# Patient Record
Sex: Male | Born: 1955 | Race: White | Hispanic: No | Marital: Married | Smoking: Never smoker
Health system: Southern US, Community
[De-identification: ages and names within clinical notes are randomized; demographics above are authoritative.]

## PROBLEM LIST (undated history)

## (undated) DIAGNOSIS — E785 Hyperlipidemia, unspecified: Secondary | ICD-10-CM

## (undated) DIAGNOSIS — Z87442 Personal history of urinary calculi: Secondary | ICD-10-CM

## (undated) DIAGNOSIS — I10 Essential (primary) hypertension: Secondary | ICD-10-CM

## (undated) DIAGNOSIS — N200 Calculus of kidney: Secondary | ICD-10-CM

## (undated) HISTORY — PX: NECK SURGERY: SHX720

## (undated) HISTORY — PX: COLONOSCOPY: SHX174

## (undated) HISTORY — PX: LITHOTRIPSY: SUR834

## (undated) HISTORY — PX: BACK SURGERY: SHX140

---

## 2003-11-05 ENCOUNTER — Ambulatory Visit (HOSPITAL_COMMUNITY): Admission: RE | Admit: 2003-11-05 | Discharge: 2003-11-06 | Payer: Self-pay | Admitting: Neurosurgery

## 2003-11-30 ENCOUNTER — Encounter: Admission: RE | Admit: 2003-11-30 | Discharge: 2003-11-30 | Payer: Self-pay | Admitting: Neurosurgery

## 2008-05-07 ENCOUNTER — Ambulatory Visit: Payer: Self-pay | Admitting: Gastroenterology

## 2009-12-01 ENCOUNTER — Observation Stay: Payer: Self-pay | Admitting: Internal Medicine

## 2013-04-28 ENCOUNTER — Ambulatory Visit: Payer: Self-pay | Admitting: Medical

## 2014-08-11 ENCOUNTER — Ambulatory Visit: Payer: Self-pay | Admitting: Internal Medicine

## 2017-01-15 ENCOUNTER — Encounter: Payer: Self-pay | Admitting: Emergency Medicine

## 2017-01-15 ENCOUNTER — Emergency Department
Admission: EM | Admit: 2017-01-15 | Discharge: 2017-01-16 | Disposition: A | Discharge status: Home / Self Care | Payer: BLUE CROSS/BLUE SHIELD | Attending: Emergency Medicine | Admitting: Emergency Medicine

## 2017-01-15 DIAGNOSIS — Z79899 Other long term (current) drug therapy: Secondary | ICD-10-CM | POA: Diagnosis not present

## 2017-01-15 DIAGNOSIS — R109 Unspecified abdominal pain: Secondary | ICD-10-CM | POA: Diagnosis present

## 2017-01-15 DIAGNOSIS — N200 Calculus of kidney: Secondary | ICD-10-CM | POA: Diagnosis not present

## 2017-01-15 DIAGNOSIS — I10 Essential (primary) hypertension: Secondary | ICD-10-CM | POA: Diagnosis not present

## 2017-01-15 HISTORY — DX: Hyperlipidemia, unspecified: E78.5

## 2017-01-15 HISTORY — DX: Essential (primary) hypertension: I10

## 2017-01-15 HISTORY — DX: Calculus of kidney: N20.0

## 2017-01-15 LAB — BASIC METABOLIC PANEL
ANION GAP: 9 (ref 5–15)
BUN: 24 mg/dL — AB (ref 6–20)
CALCIUM: 9.8 mg/dL (ref 8.9–10.3)
CO2: 26 mmol/L (ref 22–32)
CREATININE: 1.49 mg/dL — AB (ref 0.61–1.24)
Chloride: 104 mmol/L (ref 101–111)
GFR calc Af Amer: 58 mL/min — ABNORMAL LOW (ref >60)
GFR, EST NON AFRICAN AMERICAN: 50 mL/min — AB (ref >60)
GLUCOSE: 154 mg/dL — AB (ref 65–99)
Potassium: 3.4 mmol/L — ABNORMAL LOW (ref 3.5–5.1)
Sodium: 139 mmol/L (ref 135–145)

## 2017-01-15 LAB — CBC
HEMATOCRIT: 39.8 % — AB (ref 40.0–52.0)
Hemoglobin: 14.3 g/dL (ref 13.0–18.0)
MCH: 30.9 pg (ref 26.0–34.0)
MCHC: 35.8 g/dL (ref 32.0–36.0)
MCV: 86.3 fL (ref 80.0–100.0)
PLATELETS: 161 10*3/uL (ref 150–440)
RBC: 4.62 MIL/uL (ref 4.40–5.90)
RDW: 13.5 % (ref 11.5–14.5)
WBC: 10.6 10*3/uL (ref 3.8–10.6)

## 2017-01-15 NOTE — ED Triage Notes (Signed)
Pt to traige via w/c with no distress noted; pt reports x1-2hrs having right flank pain, nonradiating with no accomp symptoms; st hx of same with kidney stone

## 2017-01-16 ENCOUNTER — Encounter: Payer: Self-pay | Admitting: Emergency Medicine

## 2017-01-16 ENCOUNTER — Emergency Department: Payer: BLUE CROSS/BLUE SHIELD

## 2017-01-16 LAB — URINALYSIS, COMPLETE (UACMP) WITH MICROSCOPIC
Bacteria, UA: NONE SEEN
Bilirubin Urine: NEGATIVE
GLUCOSE, UA: NEGATIVE mg/dL
KETONES UR: NEGATIVE mg/dL
Leukocytes, UA: NEGATIVE
NITRITE: NEGATIVE
PH: 6 (ref 5.0–8.0)
Protein, ur: 30 mg/dL — AB
Specific Gravity, Urine: 1.019 (ref 1.005–1.030)

## 2017-01-16 MED ORDER — KETOROLAC TROMETHAMINE 30 MG/ML IJ SOLN
30.0000 mg | Freq: Once | INTRAMUSCULAR | Status: AC
  Administered 2017-01-16: 30 mg via INTRAVENOUS
  Filled 2017-01-16: qty 1

## 2017-01-16 MED ORDER — SODIUM CHLORIDE 0.9 % IV BOLUS (SEPSIS)
1000.0000 mL | Freq: Once | INTRAVENOUS | Status: AC
  Administered 2017-01-16: 1000 mL via INTRAVENOUS

## 2017-01-16 MED ORDER — ONDANSETRON 4 MG PO TBDP: 4.0000 mg | ORAL_TABLET | Freq: Three times a day (TID) | ORAL | 0 refills | Status: DC | PRN

## 2017-01-16 MED ORDER — OXYCODONE-ACETAMINOPHEN 5-325 MG PO TABS: 1.0000 | ORAL_TABLET | Freq: Four times a day (QID) | ORAL | 0 refills | Status: DC | PRN

## 2017-01-16 MED ORDER — HYDROMORPHONE HCL 1 MG/ML IJ SOLN
1.0000 mg | Freq: Once | INTRAMUSCULAR | Status: AC
  Administered 2017-01-16: 1 mg via INTRAVENOUS
  Filled 2017-01-16: qty 1

## 2017-01-16 MED ORDER — MORPHINE SULFATE (PF) 4 MG/ML IV SOLN
4.0000 mg | Freq: Once | INTRAVENOUS | Status: AC
  Administered 2017-01-16: 4 mg via INTRAVENOUS
  Filled 2017-01-16: qty 1

## 2017-01-16 MED ORDER — ONDANSETRON HCL 4 MG/2ML IJ SOLN
4.0000 mg | Freq: Once | INTRAMUSCULAR | Status: AC
  Administered 2017-01-16: 4 mg via INTRAVENOUS
  Filled 2017-01-16: qty 2

## 2017-01-16 NOTE — Discharge Instructions (Signed)
Please follow up with Dr Mena GoesEskridge to have your stone removed. It will not pass otherwise. Please return with any fever, vomiting and worsened pain

## 2017-01-16 NOTE — ED Provider Notes (Signed)
[facility] Emergency Department Provider Note   ____________________________________________   First MD Initiated Contact with Patient 01/15/17 2348     (approximate)  I have reviewed the triage vital signs and the nursing notes.   HISTORY  Chief Complaint Flank Pain    HPI Cameron Randall is a 61 y.o. male who comes into the hospital today thinking that he has a kidney stone. The patient reports that he has some right flank pain that started around 8 or 9 PM. The patient took some Tylenol around 738 but he states that it did not help. The patient also reports that he hasn't urinated as well so he is unsure if there is some blood in his urine. The patient has had some nausea but denies any vomiting. He is also not had any fevers. The patient rates his pain a 4 out of 10 in intensity and states it is constant and a little bit better than before. The patient does have a history of kidney stones and has had lithotripsy in the past but it was about 15-20 years ago. The patient is here today for evaluation of this right flank pain.   Past Medical History:  Diagnosis Date  . Hyperlipidemia   . Hypertension   . Kidney stones     There are no active problems to display for this patient.   Past Surgical History:  Procedure Laterality Date  . BACK SURGERY    . LITHOTRIPSY    . NECK SURGERY      Prior to Admission medications   Medication Sig Start Date End Date Taking? Authorizing Provider  carvedilol (COREG) 25 MG tablet Take 50 mg by mouth 2 (two) times daily.   Yes [provider]  lisinopril-hydrochlorothiazide (PRINZIDE,ZESTORETIC) 20-25 MG tablet Take 1 tablet by mouth daily.   Yes [provider]  pravastatin (PRAVACHOL) 80 MG tablet Take 80 mg by mouth every evening.   Yes [provider]  ondansetron (ZOFRAN ODT) 4 MG disintegrating tablet Take 1 tablet (4 mg total) by mouth every 8 (eight) hours as needed for nausea or  vomiting. 01/16/17   Rebecka Apley, MD  oxyCODONE-acetaminophen (ROXICET) 5-325 MG tablet Take 1 tablet by mouth every 6 (six) hours as needed. 01/16/17   Rebecka Apley, MD    Allergies Patient has no known allergies.  No family history on file.  Social History Social History  Substance Use Topics  . Smoking status: Never Smoker  . Smokeless tobacco: Current User  . Alcohol use No    Review of Systems  Constitutional: No fever/chills Eyes: No visual changes. ENT: No sore throat. Cardiovascular: Denies chest pain. Respiratory: Denies shortness of breath. Gastrointestinal: No abdominal pain.  No nausea, no vomiting.  No diarrhea.  No constipation. Genitourinary: Negative for dysuria. Musculoskeletal:  back pain. Skin: Negative for rash. Neurological: Negative for headaches, focal weakness or numbness.   ____________________________________________   PHYSICAL EXAM:  VITAL SIGNS: ED Triage Vitals  Enc Vitals Group     BP 01/15/17 2329 (!) 148/86     Pulse Rate 01/15/17 2329 66     Resp 01/15/17 2329 20     Temp 01/15/17 2329 97.9 F (36.6 C)     Temp Source 01/15/17 2329 Oral     SpO2 01/15/17 2329 97 %     Weight 01/15/17 2328 265 lb (120.2 kg)     Height 01/15/17 2328 5\' 6"  (1.676 m)     Head Circumference --  Peak Flow --      Pain Score 01/15/17 2328 8     Pain Loc --      Pain Edu? --      Excl. in GC? --     Constitutional: Alert and oriented. Well appearing and in moderate distress. Eyes: Conjunctivae are normal. PERRL. EOMI. Head: Atraumatic. Nose: No congestion/rhinnorhea. Mouth/Throat: Mucous membranes are moist.  Oropharynx non-erythematous. Cardiovascular: Normal rate, regular rhythm. Grossly normal heart sounds.  Good peripheral circulation. Respiratory: Normal respiratory effort.  No retractions. Lungs CTAB. Gastrointestinal: Soft and nontender. No distention. Positive bowel sounds, Right CVA tenderness Musculoskeletal: No lower  extremity tenderness nor edema.   Neurologic:  Normal speech and language.  Skin:  Skin is warm, dry and intact.  Psychiatric: Mood and affect are normal.   ____________________________________________   LABS (all labs ordered are listed, but only abnormal results are displayed)  Labs Reviewed  URINALYSIS, COMPLETE (UACMP) WITH MICROSCOPIC - Abnormal; Notable for the following:       Result Value   Color, Urine YELLOW (*)    APPearance CLEAR (*)    Hgb urine dipstick LARGE (*)    Protein, ur 30 (*)    Squamous Epithelial / LPF 0-5 (*)    All other components within normal limits  CBC - Abnormal; Notable for the following:    HCT 39.8 (*)    All other components within normal limits  BASIC METABOLIC PANEL - Abnormal; Notable for the following:    Potassium 3.4 (*)    Glucose, Bld 154 (*)    BUN 24 (*)    Creatinine, Ser 1.49 (*)    GFR calc non Af Amer 50 (*)    GFR calc Af Amer 58 (*)    All other components within normal limits   ____________________________________________  EKG  none ____________________________________________  RADIOLOGY  CT renal stone study ____________________________________________   PROCEDURES  Procedure(s) performed: None  Procedures  Critical Care performed: No  ____________________________________________   INITIAL IMPRESSION / ASSESSMENT AND PLAN / ED COURSE  Pertinent labs & imaging results that were available during my care of the patient were reviewed by me and considered in my medical decision making (see chart for details).  This is a 61 year old male who comes into the hospital today with some right flank pain. The patient did have a CT scan appears that he has a very large kidney stone. The patient has some elevation of his creatinine about 1.49 but he has no elevation of his white blood cell count notice he have any urinary tract infection on the urinalysis. I did give the patient a dose of morphine as well as a liter  of normal saline. He was still hurting so I gave him some Dilaudid and Toradol. I contacted the urologist on call Dr. Mena Goes and he did say it was okay to give the patient some Toradol. The patient's stone is too large for lithotripsy but he does need to follow-up so he can have it removed. I discussed this with the patient and he reports that he will follow-up. The patient be discharged to follow-up for his kidney stone.  Clinical Course as of Jan 17 423  Tue Jan 16, 2017  0101 9 x 7 x 22 mm RIGHT mid ureter calculus resulting and severe obstructive uropathy.  Normal appendix.   CT Renal Soundra Pilon [AW]    Clinical Course User Index [AW] Rebecka Apley, MD     ____________________________________________   FINAL CLINICAL  IMPRESSION(S) / ED DIAGNOSES  Final diagnoses:  Kidney stone      NEW MEDICATIONS STARTED DURING THIS VISIT:  Discharge Medication List as of 01/16/2017  3:24 AM    START taking these medications   Details  ondansetron (ZOFRAN ODT) 4 MG disintegrating tablet Take 1 tablet (4 mg total) by mouth every 8 (eight) hours as needed for nausea or vomiting., Starting Tue 01/16/2017, Print    oxyCODONE-acetaminophen (ROXICET) 5-325 MG tablet Take 1 tablet by mouth every 6 (six) hours as needed., Starting Tue 01/16/2017, Print         Note:  This document was prepared using Dragon voice recognition software and may include unintentional dictation errors.    Rebecka ApleyWebster, Syvilla Martin P, MD 01/16/17 [phone]

## 2017-01-17 ENCOUNTER — Other Ambulatory Visit: Payer: Self-pay | Admitting: Radiology

## 2017-01-17 ENCOUNTER — Telehealth: Payer: Self-pay | Admitting: Radiology

## 2017-01-17 ENCOUNTER — Ambulatory Visit (INDEPENDENT_AMBULATORY_CARE_PROVIDER_SITE_OTHER): Payer: BLUE CROSS/BLUE SHIELD | Admitting: Urology

## 2017-01-17 DIAGNOSIS — N200 Calculus of kidney: Secondary | ICD-10-CM | POA: Diagnosis not present

## 2017-01-17 DIAGNOSIS — N201 Calculus of ureter: Secondary | ICD-10-CM

## 2017-01-17 MED ORDER — KETOROLAC TROMETHAMINE 10 MG PO TABS: 10.0000 mg | ORAL_TABLET | Freq: Four times a day (QID) | ORAL | 0 refills | Status: DC | PRN

## 2017-01-17 NOTE — Progress Notes (Signed)
 01/17/2017 8:41 AM   Cameron Randall 12/15/1955 [id number]  Referring provider: Anderson, Marshall W, MD [address] [facility] [place], [place] [address]  Chief Complaint  Patient presents with  . Nephrolithiasis    HPI: The patient is a 60-year-old gentleman who presents after being seen in the ER for right mid ureteral calculus. In review of imaging, he had a 7 mm in width and 2.2 cm in length right mid ureteral calculus as well as a 7 mm nonobstructing left renal stone.    He currently has not taken any pain medications as he cannot work on them. He did however take some Percocet about 10 hours ago. He does feel uncomfortable. However he does appear comfortable and discussions with him. He does have no nausea or vomiting. He denies fevers or chills.   He has had stones before in the past. He has 8-10 stone episodes. He lithotripsy 1. All other stones passed spontaneously. He does not know what his stone is male. He has had a 24-hour urine study which does not know the results. He has not had stone in 10-12 years.   PMH: Past Medical History:  Diagnosis Date  . Hyperlipidemia   . Hypertension   . Kidney stones     Surgical History: Past Surgical History:  Procedure Laterality Date  . BACK SURGERY    . LITHOTRIPSY    . NECK SURGERY      Home Medications:  Allergies as of 01/17/2017   No Known Allergies     Medication List       Accurate as of 01/17/17  8:41 AM. Always use your most recent med list.          carvedilol 25 MG tablet Commonly known as:  COREG Take 50 mg by mouth 2 (two) times daily.   ketorolac 10 MG tablet Commonly known as:  TORADOL Take 1 tablet (10 mg total) by mouth every 6 (six) hours as needed.   lisinopril-hydrochlorothiazide 20-25 MG tablet Commonly known as:  PRINZIDE,ZESTORETIC Take 1 tablet by mouth daily.   ondansetron 4 MG disintegrating tablet Commonly known as:  ZOFRAN ODT Take 1 tablet (4 mg total) by  mouth every 8 (eight) hours as needed for nausea or vomiting.   oxyCODONE-acetaminophen 5-325 MG tablet Commonly known as:  ROXICET Take 1 tablet by mouth every 6 (six) hours as needed.   pravastatin 80 MG tablet Commonly known as:  PRAVACHOL Take 80 mg by mouth every evening.       Allergies: No Known Allergies  Family History: No family history on file.  Social History:  reports that he has never smoked. He uses smokeless tobacco. He reports that he does not drink alcohol. His drug history is not on file.  ROS: UROLOGY Frequent Urination?: No Hard to postpone urination?: No Burning/pain with urination?: No Get up at night to urinate?: No Leakage of urine?: No Urine stream starts and stops?: No Trouble starting stream?: No Do you have to strain to urinate?: No Blood in urine?: No Urinary tract infection?: No Sexually transmitted disease?: No Injury to kidneys or bladder?: No Painful intercourse?: No Weak stream?: No Erection problems?: No Penile pain?: No  Gastrointestinal Nausea?: No Vomiting?: No Indigestion/heartburn?: No Diarrhea?: No Constipation?: No  Constitutional Fever: No Night sweats?: No Weight loss?: No Fatigue?: No  Skin Skin rash/lesions?: No Itching?: No  Eyes Blurred vision?: No Double vision?: No  Ears/Nose/Throat Sore throat?: No Sinus problems?: No    Hematologic/Lymphatic Swollen glands?: No Easy bruising?: No  Cardiovascular Leg swelling?: No Chest pain?: No  Respiratory Cough?: No Shortness of breath?: No  Endocrine Excessive thirst?: No  Musculoskeletal Back pain?: Yes Joint pain?: No  Neurological Headaches?: No Dizziness?: No  Psychologic Depression?: No Anxiety?: No  Physical Exam: There were no vitals taken for this visit.  Constitutional:  Alert and oriented, No acute distress. HEENT: [place] AT, moist mucus membranes.  Trachea midline, no masses. Cardiovascular: No clubbing, cyanosis, or  edema. Respiratory: Normal respiratory effort, no increased work of breathing. GI: Abdomen is soft, nontender, nondistended, no abdominal masses GU: No CVA tenderness.  Skin: No rashes, bruises or suspicious lesions. Lymph: No cervical or inguinal adenopathy. Neurologic: Grossly intact, no focal deficits, moving all 4 extremities. Psychiatric: Normal mood and affect.  Laboratory Data: Lab Results  Component Value Date   WBC 10.6 01/15/2017   HGB 14.3 01/15/2017   HCT 39.8 (L) 01/15/2017   MCV 86.3 01/15/2017   PLT 161 01/15/2017    Lab Results  Component Value Date   CREATININE 1.49 (H) 01/15/2017    No results found for: PSA  No results found for: TESTOSTERONE  No results found for: HGBA1C  Urinalysis    Component Value Date/Time   COLORURINE YELLOW (A) 01/15/2017 2335   APPEARANCEUR CLEAR (A) 01/15/2017 2335   LABSPEC 1.019 01/15/2017 2335   PHURINE 6.0 01/15/2017 2335   GLUCOSEU NEGATIVE 01/15/2017 2335   HGBUR LARGE (A) 01/15/2017 2335   BILIRUBINUR NEGATIVE 01/15/2017 2335   KETONESUR NEGATIVE 01/15/2017 2335   PROTEINUR 30 (A) 01/15/2017 2335   NITRITE NEGATIVE 01/15/2017 2335   LEUKOCYTESUR NEGATIVE 01/15/2017 2335    Pertinent Imaging: CT scan reviewed as above.  Assessment & Plan:    1. Large right ureteral stone I discussed the patient that his stone is unlikely to pass on its own due to its large size. We also discussed that it's too big for lithotripsy. We discussed his best option would be cystoscopy with ureteroscopy. He understands the risks, benefits, indications of this procedure. He does understand that the large stone size may require multiple procedures or even a percutaneous nephrostomy tube if access is unable to be gained to the stone. He understands further risks include but are not limited to bleeding, infection, iatrogenic injury, need for ureteral stent/nephrostomy tube. All questions were answered. The patient will be scheduled for  surgery in the near future.  2. Left nonobstructing renal stone I discussed the patient that this would not be the main goal of this procedure due to the significant or time that it may take to break up is 2.2 cm ureteral calculus. He does know that if right ureteroscopy goes well, that this may be an option though not likely.  Riddik Senna James Chanley Mcenery, MD  [facility] [address], [address] [place], [place] [address] [phone]  

## 2017-01-17 NOTE — Telephone Encounter (Signed)
Notified pt of surgery scheduled with Dr Apolinar JunesBrandon on 01/22/17, pre-admit testing appt on 01/18/17 @12 :30 & to call Friday prior to surgery for arrival time to SDS. Questions were answered to pt's satisfaction. Pt voices understanding.

## 2017-01-18 ENCOUNTER — Encounter
Admission: RE | Admit: 2017-01-18 | Discharge: 2017-01-18 | Disposition: A | Discharge status: Home / Self Care | Payer: BLUE CROSS/BLUE SHIELD | Source: Ambulatory Visit | Attending: Urology | Admitting: Urology

## 2017-01-18 DIAGNOSIS — I10 Essential (primary) hypertension: Secondary | ICD-10-CM | POA: Diagnosis present

## 2017-01-18 DIAGNOSIS — N201 Calculus of ureter: Secondary | ICD-10-CM | POA: Diagnosis not present

## 2017-01-18 HISTORY — DX: Personal history of urinary calculi: Z87.442

## 2017-01-18 NOTE — Patient Instructions (Signed)
Your procedure is scheduled on: 01/22/17 Report to Same Day Surgery 2nd floor medical mall [facility](Medical Mall Entrance-take elevator on left to 2nd floor.  Check in with surgery information desk.) To find out your arrival time please call [phone] between 1PM - 3PM on 01/19/17  Remember: Instructions that are not followed completely may result in serious medical risk, up to and including death, or upon the discretion of your surgeon and anesthesiologist your surgery may need to be rescheduled.    _x___ 1. Do not eat food or drink liquids after midnight. No gum chewing or                              hard candies.     __x__ 2. No Alcohol for 24 hours before or after surgery.   __x__3. No Smoking for 24 prior to surgery.   ____  4. Bring all medications with you on the day of surgery if instructed.    __x__ 5. Notify your doctor if there is any change in your medical condition     (cold, fever, infections).     Do not wear jewelry, make-up, hairpins, clips or nail polish.  Do not wear lotions, powders, or perfumes. You may wear deodorant.  Do not shave 48 hours prior to surgery. Men may shave face and neck.  Do not bring valuables to the hospital.    [facility] any belongings or valuables.               Contacts, dentures or bridgework may not be worn into surgery.  Leave your suitcase in the car. After surgery it may be brought to your room.  For patients admitted to the hospital, discharge time is determined by your                       treatment team.   Patients discharged the day of surgery will not be allowed to drive home.  You will need someone to drive you home and stay with you the night of your procedure.    Please read over the following fact sheets that you were given:   [facility] Preparing for [facility] and or MRSA Information   _x___ Take anti-hypertensive (unless it includes a diuretic), cardiac, seizure, asthma,     anti-reflux and psychiatric  medicines. These include:  1. coreg  2. Pain medicine if needed  3.  4.  5.  6.  ____Fleets enema or Magnesium Citrate as directed.   ____ Use CHG Soap or sage wipes as directed on instruction sheet   ____ Use inhalers on the day of surgery and bring to hospital day of surgery  ____ Stop Metformin and Janumet 2 days prior to surgery.    ____ Take 1/2 of usual insulin dose the night before surgery and none on the morning     surgery.   ____ Follow recommendations from Cardiologist, Pulmonologist or PCP regarding          stopping Aspirin, Coumadin, Pllavix ,Eliquis, Effient, or Pradaxa, and Pletal.  ___Stop Anti-inflammatories such as Advil, Aleve, Ibuprofen, Motrin, Naproxen, Naprosyn, Goodies powders or aspirin products. OK to take Tylenol and                          Celebrex.   ___ Stop supplements until after surgery.  But may continue Vitamin D,  Vitamin B,       and multivitamin.   ____ Bring C-Pap to the hospital.

## 2017-01-19 LAB — CULTURE, URINE COMPREHENSIVE

## 2017-01-21 MED ORDER — CEFAZOLIN SODIUM-DEXTROSE 2-4 GM/100ML-% IV SOLN
2.0000 g | INTRAVENOUS | Status: AC
  Administered 2017-01-22: 2 g via INTRAVENOUS

## 2017-01-22 ENCOUNTER — Encounter: Payer: Self-pay | Admitting: *Deleted

## 2017-01-22 ENCOUNTER — Encounter
Admission: RE | Disposition: A | Discharge status: Home / Self Care | Payer: Self-pay | Source: Ambulatory Visit | Attending: Urology

## 2017-01-22 ENCOUNTER — Ambulatory Visit: Payer: BLUE CROSS/BLUE SHIELD | Admitting: Anesthesiology

## 2017-01-22 ENCOUNTER — Ambulatory Visit
Admission: RE | Admit: 2017-01-22 | Discharge: 2017-01-22 | Disposition: A | Discharge status: Home / Self Care | Payer: BLUE CROSS/BLUE SHIELD | Source: Ambulatory Visit | Attending: Urology | Admitting: Urology

## 2017-01-22 DIAGNOSIS — N132 Hydronephrosis with renal and ureteral calculous obstruction: Secondary | ICD-10-CM | POA: Insufficient documentation

## 2017-01-22 DIAGNOSIS — N200 Calculus of kidney: Secondary | ICD-10-CM | POA: Diagnosis present

## 2017-01-22 DIAGNOSIS — N201 Calculus of ureter: Secondary | ICD-10-CM

## 2017-01-22 DIAGNOSIS — E785 Hyperlipidemia, unspecified: Secondary | ICD-10-CM | POA: Insufficient documentation

## 2017-01-22 DIAGNOSIS — F17290 Nicotine dependence, other tobacco product, uncomplicated: Secondary | ICD-10-CM | POA: Diagnosis not present

## 2017-01-22 DIAGNOSIS — I10 Essential (primary) hypertension: Secondary | ICD-10-CM | POA: Insufficient documentation

## 2017-01-22 DIAGNOSIS — Z79899 Other long term (current) drug therapy: Secondary | ICD-10-CM | POA: Insufficient documentation

## 2017-01-22 HISTORY — PX: CYSTOSCOPY WITH STENT PLACEMENT: SHX5790

## 2017-01-22 HISTORY — PX: STONE EXTRACTION WITH BASKET: SHX5318

## 2017-01-22 HISTORY — PX: URETEROSCOPY WITH HOLMIUM LASER LITHOTRIPSY: SHX6645

## 2017-01-22 SURGERY — URETEROSCOPY, WITH LITHOTRIPSY USING HOLMIUM LASER
Anesthesia: General | Site: Ureter | Laterality: Right | Wound class: Clean Contaminated

## 2017-01-22 MED ORDER — HYDROCODONE-ACETAMINOPHEN 5-325 MG PO TABS: 1.0000 | ORAL_TABLET | Freq: Four times a day (QID) | ORAL | 0 refills | Status: DC | PRN

## 2017-01-22 MED ORDER — DOCUSATE SODIUM 100 MG PO CAPS: 100.0000 mg | ORAL_CAPSULE | Freq: Two times a day (BID) | ORAL | 0 refills | Status: DC

## 2017-01-22 MED ORDER — CARVEDILOL 25 MG PO TABS
ORAL_TABLET | ORAL | Status: AC
  Filled 2017-01-22: qty 2

## 2017-01-22 MED ORDER — FENTANYL CITRATE (PF) 100 MCG/2ML IJ SOLN
INTRAMUSCULAR | Status: DC | PRN
  Administered 2017-01-22: 100 ug via INTRAVENOUS

## 2017-01-22 MED ORDER — SUGAMMADEX SODIUM 200 MG/2ML IV SOLN
INTRAVENOUS | Status: AC
  Filled 2017-01-22: qty 2

## 2017-01-22 MED ORDER — SUGAMMADEX SODIUM 200 MG/2ML IV SOLN
INTRAVENOUS | Status: DC | PRN
  Administered 2017-01-22: 228.6 mg via INTRAVENOUS

## 2017-01-22 MED ORDER — MIDAZOLAM HCL 2 MG/2ML IJ SOLN
INTRAMUSCULAR | Status: DC | PRN
  Administered 2017-01-22: 2 mg via INTRAVENOUS

## 2017-01-22 MED ORDER — FAMOTIDINE 20 MG PO TABS
ORAL_TABLET | ORAL | Status: AC
  Filled 2017-01-22: qty 1

## 2017-01-22 MED ORDER — HYDROCODONE-ACETAMINOPHEN 5-325 MG PO TABS
ORAL_TABLET | ORAL | Status: AC
Start: 2017-01-22 — End: 2017-01-22
  Filled 2017-01-22: qty 1

## 2017-01-22 MED ORDER — PROPOFOL 10 MG/ML IV BOLUS
INTRAVENOUS | Status: AC
  Filled 2017-01-22: qty 20

## 2017-01-22 MED ORDER — MIDAZOLAM HCL 2 MG/2ML IJ SOLN
INTRAMUSCULAR | Status: AC
  Filled 2017-01-22: qty 2

## 2017-01-22 MED ORDER — CEFAZOLIN SODIUM-DEXTROSE 2-4 GM/100ML-% IV SOLN
INTRAVENOUS | Status: AC
  Filled 2017-01-22: qty 100

## 2017-01-22 MED ORDER — ONDANSETRON HCL 4 MG/2ML IJ SOLN: 4.0000 mg | Freq: Once | INTRAMUSCULAR | Status: DC | PRN

## 2017-01-22 MED ORDER — CARVEDILOL 25 MG PO TABS
50.0000 mg | ORAL_TABLET | Freq: Once | ORAL | Status: AC
  Administered 2017-01-22: 50 mg via ORAL
  Filled 2017-01-22: qty 2

## 2017-01-22 MED ORDER — SUCCINYLCHOLINE CHLORIDE 20 MG/ML IJ SOLN
INTRAMUSCULAR | Status: AC
  Filled 2017-01-22: qty 1

## 2017-01-22 MED ORDER — OXYBUTYNIN CHLORIDE 5 MG PO TABS: 5.0000 mg | ORAL_TABLET | Freq: Three times a day (TID) | ORAL | 0 refills | Status: DC | PRN

## 2017-01-22 MED ORDER — LACTATED RINGERS IV SOLN
INTRAVENOUS | Status: DC
  Administered 2017-01-22 (×2): via INTRAVENOUS

## 2017-01-22 MED ORDER — IOTHALAMATE MEGLUMINE 43 % IV SOLN
INTRAVENOUS | Status: DC | PRN
Start: 2017-01-22 — End: 2017-01-22
  Administered 2017-01-22: 15 mL

## 2017-01-22 MED ORDER — SEVOFLURANE IN SOLN
RESPIRATORY_TRACT | Status: AC
  Filled 2017-01-22: qty 250

## 2017-01-22 MED ORDER — FENTANYL CITRATE (PF) 100 MCG/2ML IJ SOLN: 25.0000 ug | INTRAMUSCULAR | Status: DC | PRN

## 2017-01-22 MED ORDER — ROCURONIUM BROMIDE 50 MG/5ML IV SOLN
INTRAVENOUS | Status: AC
  Filled 2017-01-22: qty 1

## 2017-01-22 MED ORDER — ONDANSETRON HCL 4 MG/2ML IJ SOLN
INTRAMUSCULAR | Status: AC
  Filled 2017-01-22: qty 2

## 2017-01-22 MED ORDER — DEXAMETHASONE SODIUM PHOSPHATE 10 MG/ML IJ SOLN
INTRAMUSCULAR | Status: AC
  Filled 2017-01-22: qty 1

## 2017-01-22 MED ORDER — PROPOFOL 10 MG/ML IV BOLUS
INTRAVENOUS | Status: DC | PRN
  Administered 2017-01-22: 200 mg via INTRAVENOUS

## 2017-01-22 MED ORDER — ONDANSETRON HCL 4 MG/2ML IJ SOLN
INTRAMUSCULAR | Status: DC | PRN
  Administered 2017-01-22: 4 mg via INTRAVENOUS

## 2017-01-22 MED ORDER — DEXAMETHASONE SODIUM PHOSPHATE 10 MG/ML IJ SOLN
INTRAMUSCULAR | Status: DC | PRN
  Administered 2017-01-22: 10 mg via INTRAVENOUS

## 2017-01-22 MED ORDER — LIDOCAINE HCL (CARDIAC) 20 MG/ML IV SOLN
INTRAVENOUS | Status: DC | PRN
  Administered 2017-01-22: 100 mg via INTRAVENOUS

## 2017-01-22 MED ORDER — FAMOTIDINE 20 MG PO TABS
20.0000 mg | ORAL_TABLET | Freq: Once | ORAL | Status: AC
  Administered 2017-01-22: 20 mg via ORAL

## 2017-01-22 MED ORDER — ROCURONIUM BROMIDE 100 MG/10ML IV SOLN
INTRAVENOUS | Status: DC | PRN
  Administered 2017-01-22 (×3): 10 mg via INTRAVENOUS
  Administered 2017-01-22: 40 mg via INTRAVENOUS
  Administered 2017-01-22: 10 mg via INTRAVENOUS

## 2017-01-22 MED ORDER — TAMSULOSIN HCL 0.4 MG PO CAPS: 0.4000 mg | ORAL_CAPSULE | Freq: Every day | ORAL | 0 refills | Status: DC

## 2017-01-22 MED ORDER — LIDOCAINE HCL (PF) 2 % IJ SOLN
INTRAMUSCULAR | Status: AC
  Filled 2017-01-22: qty 2

## 2017-01-22 MED ORDER — HYDROCODONE-ACETAMINOPHEN 5-325 MG PO TABS
1.0000 | ORAL_TABLET | Freq: Four times a day (QID) | ORAL | Status: DC | PRN
  Administered 2017-01-22: 1 via ORAL

## 2017-01-22 MED ORDER — FENTANYL CITRATE (PF) 100 MCG/2ML IJ SOLN
INTRAMUSCULAR | Status: AC
  Filled 2017-01-22: qty 2

## 2017-01-22 MED ORDER — SUCCINYLCHOLINE CHLORIDE 20 MG/ML IJ SOLN
INTRAMUSCULAR | Status: DC | PRN
  Administered 2017-01-22: 120 mg via INTRAVENOUS

## 2017-01-22 SURGICAL SUPPLY — 31 items
BAG DRAIN CYSTO-URO LG1000N (MISCELLANEOUS) ×3 IMPLANT
BASKET ZERO TIP 1.9FR (BASKET) ×3 IMPLANT
CATH URETL 5X70 OPEN END (CATHETERS) ×3 IMPLANT
CNTNR SPEC 2.5X3XGRAD LEK (MISCELLANEOUS) ×1
CONRAY 43 FOR UROLOGY 50M (MISCELLANEOUS) ×3 IMPLANT
CONT SPEC 4OZ STER OR WHT (MISCELLANEOUS) ×2
CONTAINER SPEC 2.5X3XGRAD LEK (MISCELLANEOUS) ×1 IMPLANT
DRAPE UTILITY 15X26 TOWEL STRL (DRAPES) ×3 IMPLANT
FIBER LASER LITHO 273 (Laser) ×3 IMPLANT
GLOVE BIO SURGEON STRL SZ 6.5 (GLOVE) ×2 IMPLANT
GLOVE BIO SURGEONS STRL SZ 6.5 (GLOVE) ×1
GOWN STRL REUS W/ TWL LRG LVL3 (GOWN DISPOSABLE) ×2 IMPLANT
GOWN STRL REUS W/TWL LRG LVL3 (GOWN DISPOSABLE) ×4
GUIDEWIRE GREEN .038 145CM (MISCELLANEOUS) ×3 IMPLANT
GUIDEWIRE SUPER STIFF (WIRE) IMPLANT
INFUSOR MANOMETER BAG 3000ML (MISCELLANEOUS) ×3 IMPLANT
INTRODUCER DILATOR DOUBLE (INTRODUCER) ×3 IMPLANT
KIT RM TURNOVER CYSTO AR (KITS) ×3 IMPLANT
PACK CYSTO AR (MISCELLANEOUS) ×3 IMPLANT
SCRUB POVIDONE IODINE 4 OZ (MISCELLANEOUS) ×3 IMPLANT
SENSORWIRE 0.038 NOT ANGLED (WIRE) ×6
SET CYSTO W/LG BORE CLAMP LF (SET/KITS/TRAYS/PACK) ×3 IMPLANT
SHEATH URETERAL 12FRX35CM (MISCELLANEOUS) ×3 IMPLANT
SOL .9 NS 3000ML IRR  AL (IV SOLUTION) ×2
SOL .9 NS 3000ML IRR UROMATIC (IV SOLUTION) ×1 IMPLANT
STENT URET 6FRX24 CONTOUR (STENTS) IMPLANT
STENT URET 6FRX26 CONTOUR (STENTS) ×3 IMPLANT
SURGILUBE 2OZ TUBE FLIPTOP (MISCELLANEOUS) ×3 IMPLANT
SYRINGE IRR TOOMEY STRL 70CC (SYRINGE) ×3 IMPLANT
WATER STERILE IRR 1000ML POUR (IV SOLUTION) ×3 IMPLANT
WIRE SENSOR 0.038 NOT ANGLED (WIRE) ×2 IMPLANT

## 2017-01-22 NOTE — Anesthesia Preprocedure Evaluation (Signed)
Anesthesia Evaluation  Patient identified by MRN, date of birth, ID band Patient awake    Reviewed: Allergy & Precautions, NPO status , Patient's Chart, lab work & pertinent test results  History of Anesthesia Complications Negative for: history of anesthetic complications  Airway Mallampati: II       Dental   Pulmonary neg pulmonary ROS,           Cardiovascular hypertension, Pt. on medications and Pt. on home beta blockers      Neuro/Psych negative neurological ROS     GI/Hepatic negative GI ROS, Neg liver ROS,   Endo/Other  negative endocrine ROS  Renal/GU Renal disease (stones)     Musculoskeletal   Abdominal   Peds  Hematology   Anesthesia Other Findings   Reproductive/Obstetrics                            Anesthesia Physical Anesthesia Plan  ASA: III  Anesthesia Plan: General   Post-op Pain Management:    Induction: Intravenous  Airway Management Planned: Oral ETT  Additional Equipment:   Intra-op Plan:   Post-operative Plan:   Informed Consent: I have reviewed the patients History and Physical, chart, labs and discussed the procedure including the risks, benefits and alternatives for the proposed anesthesia with the patient or authorized representative who has indicated his/her understanding and acceptance.     Plan Discussed with:   Anesthesia Plan Comments:         Anesthesia Quick Evaluation

## 2017-01-22 NOTE — Anesthesia Post-op Follow-up Note (Cosign Needed)
Anesthesia QCDR form completed.        

## 2017-01-22 NOTE — Anesthesia Procedure Notes (Signed)
Procedure Name: Intubation Date/Time: 01/22/2017 8:03 AM Performed by: Nelda Marseille Pre-anesthesia Checklist: Patient identified, Patient being monitored, Timeout performed, Emergency Drugs available and Suction available Patient Re-evaluated:Patient Re-evaluated prior to inductionOxygen Delivery Method: Circle system utilized Preoxygenation: Pre-oxygenation with 100% oxygen Intubation Type: IV induction Ventilation: Mask ventilation without difficulty Laryngoscope Size: Mac, 3 and McGraph Grade View: Grade II Tube type: Oral Tube size: 7.0 mm Number of attempts: 1 Airway Equipment and Method: Stylet and Video-laryngoscopy Placement Confirmation: ETT inserted through vocal cords under direct vision,  positive ETCO2 and breath sounds checked- equal and bilateral Secured at: 21 cm Tube secured with: Tape Dental Injury: Teeth and Oropharynx as per pre-operative assessment

## 2017-01-22 NOTE — H&P (View-Only) (Signed)
01/17/2017 8:41 AM   Cameron Randall Jun 12, 1956 [id number]  Referring provider: Lauro Regulus, MD [address] [facility] [place], [place] [address]  Chief Complaint  Patient presents with  . Nephrolithiasis    HPI: The patient is a 61 year old gentleman who presents after being seen in the ER for right mid ureteral calculus. In review of imaging, he had a 7 mm in width and 2.2 cm in length right mid ureteral calculus as well as a 7 mm nonobstructing left renal stone.    He currently has not taken any pain medications as he cannot work on them. He did however take some Percocet about 10 hours ago. He does feel uncomfortable. However he does appear comfortable and discussions with him. He does have no nausea or vomiting. He denies fevers or chills.   He has had stones before in the past. He has 8-10 stone episodes. He lithotripsy 1. All other stones passed spontaneously. He does not know what his stone is male. He has had a 24-hour urine study which does not know the results. He has not had stone in 10-12 years.   PMH: Past Medical History:  Diagnosis Date  . Hyperlipidemia   . Hypertension   . Kidney stones     Surgical History: Past Surgical History:  Procedure Laterality Date  . BACK SURGERY    . LITHOTRIPSY    . NECK SURGERY      Home Medications:  Allergies as of 01/17/2017   No Known Allergies     Medication List       Accurate as of 01/17/17  8:41 AM. Always use your most recent med list.          carvedilol 25 MG tablet Commonly known as:  COREG Take 50 mg by mouth 2 (two) times daily.   ketorolac 10 MG tablet Commonly known as:  TORADOL Take 1 tablet (10 mg total) by mouth every 6 (six) hours as needed.   lisinopril-hydrochlorothiazide 20-25 MG tablet Commonly known as:  PRINZIDE,ZESTORETIC Take 1 tablet by mouth daily.   ondansetron 4 MG disintegrating tablet Commonly known as:  ZOFRAN ODT Take 1 tablet (4 mg total) by  mouth every 8 (eight) hours as needed for nausea or vomiting.   oxyCODONE-acetaminophen 5-325 MG tablet Commonly known as:  ROXICET Take 1 tablet by mouth every 6 (six) hours as needed.   pravastatin 80 MG tablet Commonly known as:  PRAVACHOL Take 80 mg by mouth every evening.       Allergies: No Known Allergies  Family History: No family history on file.  Social History:  reports that he has never smoked. He uses smokeless tobacco. He reports that he does not drink alcohol. His drug history is not on file.  ROS: UROLOGY Frequent Urination?: No Hard to postpone urination?: No Burning/pain with urination?: No Get up at night to urinate?: No Leakage of urine?: No Urine stream starts and stops?: No Trouble starting stream?: No Do you have to strain to urinate?: No Blood in urine?: No Urinary tract infection?: No Sexually transmitted disease?: No Injury to kidneys or bladder?: No Painful intercourse?: No Weak stream?: No Erection problems?: No Penile pain?: No  Gastrointestinal Nausea?: No Vomiting?: No Indigestion/heartburn?: No Diarrhea?: No Constipation?: No  Constitutional Fever: No Night sweats?: No Weight loss?: No Fatigue?: No  Skin Skin rash/lesions?: No Itching?: No  Eyes Blurred vision?: No Double vision?: No  Ears/Nose/Throat Sore throat?: No Sinus problems?: No  Hematologic/Lymphatic Swollen glands?: No Easy bruising?: No  Cardiovascular Leg swelling?: No Chest pain?: No  Respiratory Cough?: No Shortness of breath?: No  Endocrine Excessive thirst?: No  Musculoskeletal Back pain?: Yes Joint pain?: No  Neurological Headaches?: No Dizziness?: No  Psychologic Depression?: No Anxiety?: No  Physical Exam: There were no vitals taken for this visit.  Constitutional:  Alert and oriented, No acute distress. HEENT: [place] AT, moist mucus membranes.  Trachea midline, no masses. Cardiovascular: No clubbing, cyanosis, or  edema. Respiratory: Normal respiratory effort, no increased work of breathing. GI: Abdomen is soft, nontender, nondistended, no abdominal masses GU: No CVA tenderness.  Skin: No rashes, bruises or suspicious lesions. Lymph: No cervical or inguinal adenopathy. Neurologic: Grossly intact, no focal deficits, moving all 4 extremities. Psychiatric: Normal mood and affect.  Laboratory Data: Lab Results  Component Value Date   WBC 10.6 01/15/2017   HGB 14.3 01/15/2017   HCT 39.8 (L) 01/15/2017   MCV 86.3 01/15/2017   PLT 161 01/15/2017    Lab Results  Component Value Date   CREATININE 1.49 (H) 01/15/2017    No results found for: PSA  No results found for: TESTOSTERONE  No results found for: HGBA1C  Urinalysis    Component Value Date/Time   COLORURINE YELLOW (A) 01/15/2017 2335   APPEARANCEUR CLEAR (A) 01/15/2017 2335   LABSPEC 1.019 01/15/2017 2335   PHURINE 6.0 01/15/2017 2335   GLUCOSEU NEGATIVE 01/15/2017 2335   HGBUR LARGE (A) 01/15/2017 2335   BILIRUBINUR NEGATIVE 01/15/2017 2335   KETONESUR NEGATIVE 01/15/2017 2335   PROTEINUR 30 (A) 01/15/2017 2335   NITRITE NEGATIVE 01/15/2017 2335   LEUKOCYTESUR NEGATIVE 01/15/2017 2335    Pertinent Imaging: CT scan reviewed as above.  Assessment & Plan:    1. Large right ureteral stone I discussed the patient that his stone is unlikely to pass on its own due to its large size. We also discussed that it's too big for lithotripsy. We discussed his best option would be cystoscopy with ureteroscopy. He understands the risks, benefits, indications of this procedure. He does understand that the large stone size may require multiple procedures or even a percutaneous nephrostomy tube if access is unable to be gained to the stone. He understands further risks include but are not limited to bleeding, infection, iatrogenic injury, need for ureteral stent/nephrostomy tube. All questions were answered. The patient will be scheduled for  surgery in the near future.  2. Left nonobstructing renal stone I discussed the patient that this would not be the main goal of this procedure due to the significant or time that it may take to break up is 2.2 cm ureteral calculus. He does know that if right ureteroscopy goes well, that this may be an option though not likely.  Hildred LaserBrian James Lorrayne Ismael, MD  [facility] [address], [address] [place], [place] [address] [phone]

## 2017-01-22 NOTE — Op Note (Signed)
Date of procedure: 01/22/17  Preoperative diagnosis:  1. Right obstructing ureteral calculus   Postoperative diagnosis:  1. Same as above   Procedure: 1. Right ureteroscopy 2. Laser lithotripsy 3. Right ureteral stent placement 4. Basket extraction of Stone fragment 5. Right retrograde pyelogram  Surgeon: Vanna Scotland, MD  Anesthesia: General  Complications: None  Intraoperative findings: Large 2.2 centimeter mid ureteral calculus with severe proximal hydroureteronephrosis. Stone was adequately cleared from ureter  EBL: minimal   Specimens: stone fragment  Drains: 6 x 26 Fr JJ ureteral stone stent on right  Indication: Cameron Randall is a 61 y.o. patient with 2.2 cm obstructing right mid ureteral stone with severe proximal hydroureteronephrosis..  After reviewing the management options for treatment, he elected to proceed with the above surgical procedure(s). We have discussed the potential benefits and risks of the procedure, side effects of the proposed treatment, the likelihood of the patient achieving the goals of the procedure, and any potential problems that might occur during the procedure or recuperation. Informed consent has been obtained.  Description of procedure:  The patient was taken to the operating room and general anesthesia was induced.  The patient was placed in the dorsal lithotomy position, prepped and draped in the usual sterile fashion, and preoperative antibiotics were administered. A preoperative time-out was performed.   A 21 French scope was advanced per urethra into the bladder. Attention was turned to the right ureteral orifice which was cannulated using a 5 [place] open-ended ureteral catheter and a gentle retrograde pyelogram was performed. On scout imaging, the stone could be seen within the mid ureter and was easily visible. Contrast refluxed up to level of the stone with a large filling defect in this area and required more pressure in order for  contrast to a flexible above the level of the stone which was massively dilated. With some maneuvering, I was able to get a sensor wire beyond the stone up to level of the renal pelvis. The 5 [place] open-ended ureteral catheter was advanced up to the level of the kidney and the wire was withdrawn. A second retrograde pyelogram was performed to ensure that the wire was indeed within the renal pelvis which was confirmed. The wire was replaced and the open-ended was removed. A 4.5 French semirigid ureteroscope was then advanced up to the mid ureter but due to the need for torquing of the ureter, was unable to adequately reach the stone. As such, a second wire was coiled just distal to the stone. A 7 French flexible ureteroscope was advanced up to level of the stone and carefully fragmented using a 273  laser fiber with settings of 0.8 J and 10 Hz. A small portion of the stone, approximately 8 mm refluxed back into the renal pelvis and was chased up into the kidney. This was fragmented into smaller pieces. Of note, the renal pelvis as well as calyces where dilated and hydronephrotic. I did aspirate the kidney several times to drain it. This point time given the overall stone burden within the renal pelvis as well as the massive dilation, the decision was made to place a Super Stiff wire under direct visualization followed by a ureteral access sheath in order to help facilitate basket extraction of stone material at the level of the kidney. This was done without difficulty. A 1.9 French to plus nitinol basket was then used to extract all significant stone material. Patient every calyx was then directly visualized. When no significant residual stone burden was visible,  additional contrast material was injected into the renal pelvis to help facilitate placement of the ureteral stent. The scope was then backed down the length of the ureter inspecting along the way. A few small stone fragments were extracted via basket upon  access sheath removal. Finally, the safety wire was then backloaded over a rigid cystoscope. A 6 x 26 French double-J ureteral stent was advanced over the wire up to level of the renal pelvis. The wire was partially drawn until full coil was noted within the renal pelvis. Wire was then fully withdrawn and a full coil was noted within the bladder. The bladder was then drained. The patient was having cleaned and dried, repositioned the supine position, reversed from anesthesia, taken the PACU in stable condition.    Plan: We'll leave the stent in for 2 weeks given the amount of stone debris.  Vanna ScotlandAshley Melisse Caetano, M.D.

## 2017-01-22 NOTE — Interval H&P Note (Signed)
History and Physical Interval Note:  01/22/2017 7:22 AM  Cameron Randall  has presented today for surgery, with the diagnosis of right ureteral stone  The various methods of treatment have been discussed with the patient and family. After consideration of risks, benefits and other options for treatment, the patient has consented to  Procedure(s): URETEROSCOPY WITH HOLMIUM LASER LITHOTRIPSY (Right) CYSTOSCOPY WITH STENT PLACEMENT (Right) as a surgical intervention .  The patient's history has been reviewed, patient examined, no change in status, stable for surgery.  I have reviewed the patient's chart and labs.  Questions were answered to the patient's satisfaction.    RRR CTAB  Vanna ScotlandAshley Makaiah Terwilliger

## 2017-01-22 NOTE — Transfer of Care (Signed)
Immediate Anesthesia Transfer of Care Note  Patient: Cameron Randall  Procedure(s) Performed: Procedure(s): URETEROSCOPY WITH HOLMIUM LASER LITHOTRIPSY (Right) CYSTOSCOPY WITH STENT PLACEMENT (Right) STONE EXTRACTION WITH BASKET (Right)  Patient Location: PACU  Anesthesia Type:General  Level of Consciousness: sedated  Airway & Oxygen Therapy: Patient Spontanous Breathing and Patient connected to face mask oxygen  Post-op Assessment: Report given to RN and Post -op Vital signs reviewed and stable  Post vital signs: Reviewed and stable  Last Vitals:  Vitals:   01/22/17 0617  BP: (!) 151/89  Pulse: 64  Resp: 20  Temp: 36.5 C    Last Pain: There were no vitals filed for this visit.       Complications: No apparent anesthesia complications

## 2017-01-22 NOTE — Anesthesia Postprocedure Evaluation (Signed)
Anesthesia Post Note  Patient: Cameron Randall  Procedure(s) Performed: Procedure(s) (LRB): URETEROSCOPY WITH HOLMIUM LASER LITHOTRIPSY (Right) CYSTOSCOPY WITH STENT PLACEMENT (Right) STONE EXTRACTION WITH BASKET (Right)  Patient location during evaluation: PACU Anesthesia Type: General Level of consciousness: awake and alert Pain management: pain level controlled Vital Signs Assessment: post-procedure vital signs reviewed and stable Respiratory status: spontaneous breathing and respiratory function stable Cardiovascular status: stable Anesthetic complications: no     Last Vitals:  Vitals:   01/22/17 1011 01/22/17 1019  BP:  130/78  Pulse: 73 68  Resp: 17 20  Temp:      Last Pain:  Vitals:   01/22/17 1019  PainSc: 0-No pain                 Edi Gorniak K

## 2017-01-22 NOTE — Discharge Instructions (Signed)
AMBULATORY SURGERY  °DISCHARGE INSTRUCTIONS ° ° °1) The drugs that you were given will stay in your system until tomorrow so for the next 24 hours you should not: ° °A) Drive an automobile °B) Make any legal decisions °C) Drink any alcoholic beverage ° ° °2) You may resume regular meals tomorrow.  Today it is better to start with liquids and gradually work up to solid foods. ° °You may eat anything you prefer, but it is better to start with liquids, then soup and crackers, and gradually work up to solid foods. ° ° °3) Please notify your doctor immediately if you have any unusual bleeding, trouble breathing, redness and pain at the surgery site, drainage, fever, or pain not relieved by medication. °4)  ° °5) Your post-operative visit with Dr.                     °           °     is: Date:                        Time:   ° °Please call to schedule your post-operative visit. ° °6) Additional Instructions: °You have a ureteral stent in place.  This is a tube that extends from your kidney to your bladder.  This may cause urinary bleeding, burning with urination, and urinary frequency.  Please call our office or present to the ED if you develop fevers >101 or pain which is not able to be controlled with oral pain medications.  You may be given either Flomax and/ or ditropan to help with bladder spasms and stent pain in addition to pain medications.   ° °[facility] °[address], [address] °[place], [place] [address] °[phone] ° °

## 2017-01-28 LAB — STONE ANALYSIS
Ca Oxalate,Dihydrate: 5 %
Ca Oxalate,Monohydr.: 90 %
Ca phos cry stone ql IR: 5 %
Stone Weight KSTONE: 149.8 mg

## 2017-02-08 ENCOUNTER — Ambulatory Visit: Payer: BLUE CROSS/BLUE SHIELD | Admitting: Urology

## 2017-02-08 ENCOUNTER — Encounter: Payer: Self-pay | Admitting: Urology

## 2017-02-08 VITALS — BP 116/71 | HR 70 | Ht 67.0 in | Wt 252.0 lb

## 2017-02-08 DIAGNOSIS — N1330 Unspecified hydronephrosis: Secondary | ICD-10-CM

## 2017-02-08 DIAGNOSIS — N201 Calculus of ureter: Secondary | ICD-10-CM

## 2017-02-08 DIAGNOSIS — N200 Calculus of kidney: Secondary | ICD-10-CM | POA: Diagnosis not present

## 2017-02-08 LAB — URINALYSIS, COMPLETE
Bilirubin, UA: NEGATIVE
Glucose, UA: NEGATIVE
KETONES UA: NEGATIVE
NITRITE UA: NEGATIVE
PH UA: 5.5 (ref 5.0–7.5)
Specific Gravity, UA: 1.025 (ref 1.005–1.030)
Urobilinogen, Ur: 0.2 mg/dL (ref 0.2–1.0)

## 2017-02-08 LAB — MICROSCOPIC EXAMINATION: Epithelial Cells (non renal): NONE SEEN /hpf (ref 0–10)

## 2017-02-08 MED ORDER — CIPROFLOXACIN HCL 500 MG PO TABS
500.0000 mg | ORAL_TABLET | Freq: Once | ORAL | Status: AC
  Administered 2017-02-08: 500 mg via ORAL

## 2017-02-08 MED ORDER — LIDOCAINE HCL 2 % EX GEL
1.0000 "application " | Freq: Once | CUTANEOUS | Status: AC
  Administered 2017-02-08: 1 via URETHRAL

## 2017-02-08 NOTE — Progress Notes (Signed)
   02/08/17  CC:  Chief Complaint  Patient presents with  . Cysto Stent Removal    HPI: 61 y.o. patient with 2.2 cm obstructing right mid ureteral stone with severe proximal hydroureteronephrosis s/p right URS, LL, stent on 01/22/17.  He has tolerated the stent well but anxious to have it removed today.  UA reviewed, c/w stent.    Blood pressure 116/71, pulse 70, height 5\' 7"  (1.702 m), weight 252 lb (114.3 kg). NED. A&Ox3.   No respiratory distress   Abd soft, NT, ND Normal phallus with bilateral descended testicles  Cystoscopy/ Stent removal procedure  Patient identification was confirmed, informed consent was obtained, and patient was prepped using Betadine solution.  Lidocaine jelly was administered per urethral meatus.    Preoperative abx where received prior to procedure.    Procedure: - Flexible cystoscope introduced, without any difficulty.   - Thorough search of the bladder revealed:    normal urethral meatus  Stent seen emanating from right ureteral orifice, grasped with stent graspers, and removed in entirety.    Post-Procedure: - Patient tolerated the procedure well   Assessment/ Plan:  1. Right ureteral stone S/p uncomplicated ureteroscopy Stent removed today Discussed warning symptoms and indications for urgent attention - Urinalysis, Complete - ciprofloxacin (CIPRO) tablet 500 mg; Take 1 tablet (500 mg total) by mouth once. - lidocaine (XYLOCAINE) 2 % jelly 1 application; Place 1 application into the urethra once. - US Renal; Future  2. Hydronephrosis of right kidney Plan for f/u RUS in 4 weeks to assess for resolution  Return in about 4 weeks (around 03/08/2017) for RUS prior.   Vanna ScotlandAshley Anwar Sakata, MD

## 2017-03-09 ENCOUNTER — Ambulatory Visit: Payer: BLUE CROSS/BLUE SHIELD

## 2017-03-14 ENCOUNTER — Telehealth: Payer: Self-pay | Admitting: Urology

## 2017-03-14 NOTE — Telephone Encounter (Signed)
Please ensure that he gets his follow-up renal ultrasound. This was ordered but not yet done.  Vanna ScotlandAshley Rhyland Hinderliter, MD

## 2017-03-14 NOTE — Telephone Encounter (Signed)
patient cancelled his follow up app on 03-16-17 because his wife is ill and he will cb later to reschd.  Cameron DusterMichelle

## 2017-03-15 NOTE — Telephone Encounter (Signed)
Patient no showed for his RUS on 03-09-17 but I have reached out to scheduling and they are calling him today to reschedule it.  Thanks,  Marcelino DusterMichelle

## 2017-03-16 ENCOUNTER — Ambulatory Visit: Payer: BLUE CROSS/BLUE SHIELD | Admitting: Urology

## 2019-06-10 ENCOUNTER — Ambulatory Visit: Payer: Self-pay

## 2019-06-10 ENCOUNTER — Other Ambulatory Visit: Payer: Self-pay

## 2019-06-10 DIAGNOSIS — Z23 Encounter for immunization: Secondary | ICD-10-CM

## 2019-10-27 ENCOUNTER — Ambulatory Visit: Payer: Self-pay | Attending: Internal Medicine

## 2019-10-27 DIAGNOSIS — Z23 Encounter for immunization: Secondary | ICD-10-CM | POA: Insufficient documentation

## 2019-10-27 NOTE — Progress Notes (Signed)
   Covid-19 Vaccination Clinic  Name:  VAISHNAV DEMARTIN    MRN: [id number] DOB: March 07, 1956  10/27/2019  Mr. Kauzlarich was observed post Covid-19 immunization for 15 minutes without incident. He was provided with Vaccine Information Sheet and instruction to access the V-Safe system.   Mr. Fults was instructed to call 911 with any severe reactions post vaccine: Marland Kitchen Difficulty breathing  . Swelling of face and throat  . A fast heartbeat  . A bad rash all over body  . Dizziness and weakness   Immunizations Administered    Name Date Dose VIS Date Route   Pfizer COVID-19 Vaccine 10/27/2019 10:43 AM 0.3 mL 08/01/2019 Intramuscular   Manufacturer: [employer], [employer]   Lot: [id number]   NDC: [address]-4   Pfizer COVID-19 Vaccine 10/27/2019 10:48 AM 0.3 mL 08/01/2019 Intramuscular   Manufacturer: [employer], [employer]   Lot: [id number]   NDC: [address]-9

## 2019-11-18 ENCOUNTER — Ambulatory Visit: Payer: BC Managed Care – PPO | Attending: Internal Medicine

## 2019-11-18 DIAGNOSIS — Z23 Encounter for immunization: Secondary | ICD-10-CM

## 2019-11-18 NOTE — Progress Notes (Signed)
   Covid-19 Vaccination Clinic  Name:  Cameron Randall    MRN: [id number] DOB: 1955/10/31  11/18/2019  Mr. [name] was observed post Covid-19 immunization for 15 minutes without incident. He was provided with Vaccine Information Sheet and instruction to access the V-Safe system.   Mr. [name] was instructed to call 911 with any severe reactions post vaccine: Marland Kitchen Difficulty breathing  . Swelling of face and throat  . A fast heartbeat  . A bad rash all over body  . Dizziness and weakness   Immunizations Administered    Name Date Dose VIS Date Route   Pfizer COVID-19 Vaccine 11/18/2019  3:15 PM 0.3 mL 08/01/2019 Intramuscular   Manufacturer: [employer], [employer]   Lot: [id number]   NDC: [address]-7

## 2020-04-23 ENCOUNTER — Other Ambulatory Visit: Payer: Self-pay | Admitting: Physician Assistant

## 2020-04-23 ENCOUNTER — Other Ambulatory Visit: Payer: Self-pay

## 2020-04-23 ENCOUNTER — Ambulatory Visit
Admission: RE | Admit: 2020-04-23 | Discharge: 2020-04-23 | Disposition: A | Discharge status: Home / Self Care | Payer: BC Managed Care – PPO | Source: Ambulatory Visit | Attending: Physician Assistant | Admitting: Physician Assistant

## 2020-04-23 DIAGNOSIS — R1012 Left upper quadrant pain: Secondary | ICD-10-CM | POA: Diagnosis present

## 2020-04-28 ENCOUNTER — Other Ambulatory Visit: Payer: Self-pay | Admitting: Physician Assistant

## 2020-04-28 DIAGNOSIS — R109 Unspecified abdominal pain: Secondary | ICD-10-CM

## 2020-04-28 DIAGNOSIS — R1012 Left upper quadrant pain: Secondary | ICD-10-CM

## 2020-05-04 ENCOUNTER — Other Ambulatory Visit: Payer: Self-pay

## 2020-05-04 ENCOUNTER — Encounter: Payer: Self-pay | Admitting: Urology

## 2020-05-04 ENCOUNTER — Ambulatory Visit: Payer: BC Managed Care – PPO | Admitting: Urology

## 2020-05-04 VITALS — BP 154/89 | HR 84 | Ht 68.0 in | Wt 260.0 lb

## 2020-05-04 DIAGNOSIS — R3129 Other microscopic hematuria: Secondary | ICD-10-CM | POA: Diagnosis not present

## 2020-05-04 DIAGNOSIS — N200 Calculus of kidney: Secondary | ICD-10-CM | POA: Diagnosis not present

## 2020-05-04 LAB — URINALYSIS, COMPLETE
Bilirubin, UA: NEGATIVE
Glucose, UA: NEGATIVE
Ketones, UA: NEGATIVE
Leukocytes,UA: NEGATIVE
Nitrite, UA: NEGATIVE
Specific Gravity, UA: >1.03 — ABNORMAL HIGH (ref 1.005–1.030)
Urobilinogen, Ur: 0.2 mg/dL (ref 0.2–1.0)
pH, UA: 5.5 (ref 5.0–7.5)

## 2020-05-04 LAB — MICROSCOPIC EXAMINATION

## 2020-05-04 NOTE — Progress Notes (Signed)
05/04/2020 5:12 PM   Cameron Randall Nov 15, 1955 [id number]  Referring provider: Ignacia Bayley, PA-C [address] [place],  [place] [address] Chief Complaint  Patient presents with  . Flank Pain    New Patient    HPI: Cameron Randall is a 64 y.o. male who presents today for evaluation and management of acute left flank pain.   Patient was last seen in clinic in 2018.  He has a history of kidney stones. CT from 2018 noted right and left 9 mm stones. Had underwent extraction of right ureter stone on 01/22/2017.   He saw Dr. Mel Almond on 04/23/2020 and noted he passed a kidney stone earlier in the week. He had pain upon passing the stone. Denied any other pain. He later developed left flank and lower abdominal pain. He had mild nausea. Patient was placed on Flomax and Percocet. UA noted large blood, 10-15 RBC and rare bacteria. No associated urine culture.   CT renal stone study on 04/23/20 revealed mild left hydronephrosis and hydroureter. Two adjacent stones within the left posterior bladder measuring 5 and 3 mm, likely reflecting recently passed kidney stones. Multiple intrarenal stones on the left.  Most recent PSA was 0.63 on 02/11/20.  He reports passing a stone on May 16, 2020 and 2 stones passed on May 19, 2020. He had flank pain with stone passage. He denies flank pain today.   He likes to drink Dr. Reino Kent every morning during break and a gallon of tea a week. He likes to drink water.   PMH: Past Medical History:  Diagnosis Date  . History of kidney stones   . Hyperlipidemia   . Hypertension   . Kidney stones     Surgical History: Past Surgical History:  Procedure Laterality Date  . BACK SURGERY    . CYSTOSCOPY WITH STENT PLACEMENT Right 01/22/2017   Procedure: CYSTOSCOPY WITH STENT PLACEMENT;  Surgeon: Cameron Scotland, MD;  Location: [facility];  Service: Urology;  Laterality: Right;  . LITHOTRIPSY    . NECK SURGERY    . STONE EXTRACTION WITH BASKET  Right 01/22/2017   Procedure: STONE EXTRACTION WITH BASKET;  Surgeon: Cameron Scotland, MD;  Location: [facility];  Service: Urology;  Laterality: Right;  . URETEROSCOPY WITH HOLMIUM LASER LITHOTRIPSY Right 01/22/2017   Procedure: URETEROSCOPY WITH HOLMIUM LASER LITHOTRIPSY;  Surgeon: Cameron Scotland, MD;  Location: [facility];  Service: Urology;  Laterality: Right;    Home Medications:  Allergies as of 05/04/2020   No Known Allergies     Medication List       Accurate as of May 04, 2020  5:12 PM. If you have any questions, ask your nurse or doctor.        STOP taking these medications   docusate sodium 100 MG capsule Commonly known as: COLACE Stopped by: Cameron Scotland, MD   HYDROcodone-acetaminophen 5-325 MG tablet Commonly known as: NORCO/VICODIN Stopped by: Cameron Scotland, MD   ondansetron 4 MG disintegrating tablet Commonly known as: Zofran ODT Stopped by: Cameron Scotland, MD   oxybutynin 5 MG tablet Commonly known as: DITROPAN Stopped by: Cameron Scotland, MD   oxyCODONE-acetaminophen 5-325 MG tablet Commonly known as: Roxicet Stopped by: Cameron Scotland, MD     TAKE these medications   carvedilol 25 MG tablet Commonly known as: COREG Take 50 mg by mouth 2 (two) times daily.   ketorolac 10 MG tablet Commonly known as: TORADOL Take 1 tablet (10 mg total) by mouth every 6 (six) hours as needed.  lisinopril-hydrochlorothiazide 20-25 MG tablet Commonly known as: ZESTORETIC Take 1 tablet by mouth daily.   pravastatin 80 MG tablet Commonly known as: PRAVACHOL Take 80 mg by mouth every evening.   tamsulosin 0.4 MG Caps capsule Commonly known as: Flomax Take 1 capsule (0.4 mg total) by mouth daily.       Allergies: No Known Allergies  Family History: No family history on file.  Social History:  reports that he has never smoked. He uses smokeless tobacco. He reports that he does not drink alcohol and does not use drugs.   Physical Exam: BP (!) 154/89    Pulse 84   Ht 5\' 8"  (1.727 m)   Wt 260 lb (117.9 kg)   BMI 39.53 kg/m   Constitutional:  Alert and oriented, No acute distress. HEENT: [place] AT, moist mucus membranes.  Trachea midline, no masses. Cardiovascular: No clubbing, cyanosis, or edema. Respiratory: Normal respiratory effort, no increased work of breathing. Skin: No rashes, bruises or suspicious lesions. Neurologic: Grossly intact, no focal deficits, moving all 4 extremities. Psychiatric: Normal mood and affect.   Urinalysis 6-10 RBC, 11-30 WBC otherwise unremarkable.   Pertinent Imaging:  Results for orders placed during the hospital encounter of 04/23/20  CT RENAL STONE STUDY  Narrative CLINICAL DATA:  Left flank pain  EXAM: CT ABDOMEN AND PELVIS WITHOUT CONTRAST  TECHNIQUE: Multidetector CT imaging of the abdomen and pelvis was performed following the standard protocol without IV contrast.  COMPARISON:  CT 01/16/2017  FINDINGS: Lower chest: Lung bases demonstrate stable subpleural nodularity at the right middle lobe. No acute consolidation or effusion.  Hepatobiliary: No focal liver abnormality is seen. No gallstones, gallbladder wall thickening, or biliary dilatation.  Pancreas: Unremarkable. No pancreatic ductal dilatation or surrounding inflammatory changes.  Spleen: Normal in size without focal abnormality.  Adrenals/Urinary Tract: Adrenal glands are normal. Lobulated renal contour bilaterally. Areas of cortical scarring in the right kidney. Hypodense renal cortical lesions bilaterally, cannot be further evaluated without contrast. Multiple intrarenal stones on the left, including 17 mm stone in the midpole. Mild left hydronephrosis and hydroureter. Two adjacent stones within the left posterior bladder measuring 5 and 3 mm, likely reflecting recently passed kidney stones. The bladder is otherwise unremarkable.  Stomach/Bowel: Stomach is within normal limits. Appendix appears normal. No evidence of  bowel wall thickening, distention, or inflammatory changes.  Vascular/Lymphatic: Mild aortic atherosclerosis. No aneurysm. No suspicious adenopathy.  Reproductive: Prostate calcification.  No mass.  Other: Negative for free air or free fluid. Fat containing inguinal hernias bilaterally. Small fat containing umbilical hernia.  Musculoskeletal: No acute or significant osseous findings.  IMPRESSION: 1. Mild left hydronephrosis and hydroureter. Two adjacent stones within the left posterior bladder measuring 5 and 3 mm, likely reflecting recently passed kidney stones. 2. Multiple intrarenal stones on the left.  Aortic Atherosclerosis (ICD10-I70.0).   Electronically Signed By: 01/18/2017 M.D. On: 04/23/2020 16:56   I have personally reviewed the images and disagree with radiologist interpretation. I believe the stones are lodged in the UVJ and are ureteral calculi.   Assessment & Plan:    1. Acute left flank pain Pt recently passed distal calcucli x 2.   We discussed various treatment options for left nonobstructing including observation vs ESWL vs. ureteroscopy, laser lithotripsy, and stent. We discussed the risks and benefits of both including bleeding, infection, damage to surrounding structures, efficacy with need for possible further intervention, and need for temporary ureteral stent.  Behavorial modification were discussed along with general stone prevention  techniques including drinking plenty water with goal of producing 2.5 L urine daily, increased citric acid intake, avoidance of high oxalate containing foods, and decreased salt intake.  Information about dietary recommendations given today.   Patient elected to continue observation of stone with KUB in 6 months.  2. Microscopic hematuria Likely related to recent stone passage.  Will repeat UA in 1 month.  If hematuria is persistent may consider CTU work up.   Return in about 4 weeks (around 06/01/2020) for 68mo  w/PA , 74mo w/KUB.  [facility] [address] [place], [place] [address] [phone]  I, Theador Hawthorne, am acting as a scribe for Dr. Vanna Randall.  I have reviewed the above documentation for accuracy and completeness, and I agree with the above.   Cameron Scotland, MD

## 2020-05-23 ENCOUNTER — Ambulatory Visit
Admission: EM | Admit: 2020-05-23 | Discharge: 2020-05-23 | Disposition: A | Discharge status: Home / Self Care | Payer: BC Managed Care – PPO | Attending: Internal Medicine | Admitting: Internal Medicine

## 2020-05-23 ENCOUNTER — Other Ambulatory Visit: Payer: Self-pay

## 2020-05-23 DIAGNOSIS — M109 Gout, unspecified: Secondary | ICD-10-CM | POA: Diagnosis not present

## 2020-05-23 MED ORDER — METHYLPREDNISOLONE 4 MG PO TBPK: ORAL_TABLET | ORAL | 0 refills | Status: DC

## 2020-05-23 MED ORDER — COLCHICINE 0.6 MG PO TABS: 0.6000 mg | ORAL_TABLET | Freq: Every day | ORAL | 0 refills | Status: DC

## 2020-05-23 MED ORDER — IBUPROFEN 800 MG PO TABS: 800.0000 mg | ORAL_TABLET | Freq: Three times a day (TID) | ORAL | 0 refills | Status: DC

## 2020-05-23 NOTE — ED Provider Notes (Signed)
[facility]    CSN: [id number] Arrival date & time: 05/23/20  1246      History   Chief Complaint Chief Complaint  Patient presents with  . Gout    HPI Cameron Randall is a 64 y.o. male who presents today for evaluation of right great toe pain.  The patient reports that his symptoms have been ongoing for the past several days.  He denies any injury or trauma affecting the right foot.  Pain is located in the right great toe.  He has noticed some swelling and erythema.  He does not drink alcohol.  He does eat occasional red meat.  No personal history of gout.  He denies any fevers or chills at home.  Denies any numbness or tingling to the right lower extremity.  He does state that the right toe is moderately painful.  He has been taking over-the-counter ibuprofen as needed for discomfort at this time.  HPI  Past Medical History:  Diagnosis Date  . History of kidney stones   . Hyperlipidemia   . Hypertension   . Kidney stones     There are no problems to display for this patient.   Past Surgical History:  Procedure Laterality Date  . BACK SURGERY    . CYSTOSCOPY WITH STENT PLACEMENT Right 01/22/2017   Procedure: CYSTOSCOPY WITH STENT PLACEMENT;  Surgeon: Vanna Scotland, MD;  Location: [facility];  Service: Urology;  Laterality: Right;  . LITHOTRIPSY    . NECK SURGERY    . STONE EXTRACTION WITH BASKET Right 01/22/2017   Procedure: STONE EXTRACTION WITH BASKET;  Surgeon: Vanna Scotland, MD;  Location: [facility];  Service: Urology;  Laterality: Right;  . URETEROSCOPY WITH HOLMIUM LASER LITHOTRIPSY Right 01/22/2017   Procedure: URETEROSCOPY WITH HOLMIUM LASER LITHOTRIPSY;  Surgeon: Vanna Scotland, MD;  Location: [facility];  Service: Urology;  Laterality: Right;       Home Medications    Prior to Admission medications   Medication Sig Start Date End Date Taking? Authorizing Provider  carvedilol (COREG) 25 MG tablet Take 50 mg by mouth 2 (two) times daily.    [provider]  colchicine 0.6 MG tablet Take 1 tablet (0.6 mg total) by mouth daily. 05/23/20   Anson Oregon, PA-C  ibuprofen (ADVIL) 800 MG tablet Take 1 tablet (800 mg total) by mouth 3 (three) times daily. 05/23/20   Anson Oregon, PA-C  lisinopril-hydrochlorothiazide (PRINZIDE,ZESTORETIC) 20-25 MG tablet Take 1 tablet by mouth daily.    [provider]  methylPREDNISolone (MEDROL DOSEPAK) 4 MG TBPK tablet Take per package instructions. 05/23/20   Anson Oregon, PA-C  pravastatin (PRAVACHOL) 80 MG tablet Take 80 mg by mouth every evening.    [provider]  tamsulosin (FLOMAX) 0.4 MG CAPS capsule Take 1 capsule (0.4 mg total) by mouth daily. 01/22/17   Vanna Scotland, MD    Family History History reviewed. No pertinent family history.  Social History Social History   Tobacco Use  . Smoking status: Never Smoker  . Smokeless tobacco: Current User    Types: Snuff  Vaping Use  . Vaping Use: Never used  Substance Use Topics  . Alcohol use: No  . Drug use: No     Allergies   Patient has no known allergies.   Review of Systems Review of Systems  Musculoskeletal: Positive for arthralgias and joint swelling.  All other systems reviewed and are negative.  Physical Exam Triage Vital Signs ED Triage Vitals  Enc Vitals Group     BP 05/23/20 1315 139/78     Pulse Rate 05/23/20 1315 69     Resp 05/23/20 1315 18     Temp 05/23/20 1315 97.8 F (36.6 C)     Temp Source 05/23/20 1315 Oral     SpO2 05/23/20 1315 97 %     Weight 05/23/20 1314 268 lb (121.6 kg)     Height 05/23/20 1314 5\' 7"  (1.702 m)     Head Circumference --      Peak Flow --      Pain Score 05/23/20 1314 4     Pain Loc --      Pain Edu? --      Excl. in GC? --    No data found.  Updated Vital Signs BP 139/78 (BP Location: Left Arm)   Pulse 69   Temp 97.8 F (36.6 C) (Oral)   Resp 18   Ht 5\' 7"  (1.702 m)   Wt 268 lb (121.6 kg)   SpO2 97%   BMI 41.97 kg/m   Visual  Acuity Right Eye Distance:   Left Eye Distance:   Bilateral Distance:    Right Eye Near:   Left Eye Near:    Bilateral Near:     Physical Exam Examination of the right foot does demonstrate swelling to the right MTP joint.  There is mild erythema.  No open wound or evidence of infection.  The patient is tender to palpation over the right first MTP joint.  The patient is able to flex and extend the great toe without a significant amount of discomfort.  The patient is intact to light touch on the right lower extremity.  Cap refill is intact to each individual toe.  UC Treatments / Results  Labs (all labs ordered are listed, but only abnormal results are displayed) Labs Reviewed - No data to display  EKG   Radiology No results found.  Procedures Procedures (including critical care time)  Medications Ordered in UC Medications - No data to display  Initial Impression / Assessment and Plan / UC Course  I have reviewed the triage vital signs and the nursing notes.  Pertinent labs & imaging results that were available during my care of the patient were reviewed by me and considered in my medical decision making (see chart for details).     1.  Treatment options were discussed today with the patient. 2.  The patient's history and physical exam is consistent with acute gout flare of the right great toe. 3.  The patient was given a Medrol Dosepak to take over the next 6 days, prescription for 100 mg ibuprofen in addition to colchicine. 4.  The patient was instructed to elevate the foot as much as he can at home.  Follow-up with his primary care physician to discuss possible chronic management. Final Clinical Impressions(s) / UC Diagnoses   Final diagnoses:  Acute gout involving toe of right foot, unspecified cause     Discharge Instructions     -Take medications as directed. -Ice the affected toe as needed. -Follow-up with PCP for further evaluation.   ED Prescriptions     Medication Sig Dispense Auth. Provider   ibuprofen (ADVIL) 800 MG tablet Take 1 tablet (800 mg total) by mouth 3 (three) times daily. 21 tablet 07/23/20, PA-C   methylPREDNISolone (MEDROL DOSEPAK) 4 MG TBPK tablet Take per package instructions. 21 tablet , PA-C   colchicine 0.6 MG tablet  Take 1 tablet (0.6 mg total) by mouth daily. 10 tablet Anson Oregon, PA-C     PDMP not reviewed this encounter.   Anson Oregon, PA-C 05/23/20 1354

## 2020-05-23 NOTE — ED Triage Notes (Signed)
Pt with past few days of right great toe pain and swelling. States it was reddened but has improved some. Much worse during the night last night

## 2020-05-23 NOTE — Discharge Instructions (Addendum)
-  Take medications as directed. -Ice the affected toe as needed. -Follow-up with PCP for further evaluation.

## 2020-06-01 ENCOUNTER — Other Ambulatory Visit: Payer: Self-pay

## 2020-06-01 ENCOUNTER — Encounter: Payer: Self-pay | Admitting: Physician Assistant

## 2020-06-01 ENCOUNTER — Ambulatory Visit (INDEPENDENT_AMBULATORY_CARE_PROVIDER_SITE_OTHER): Payer: BC Managed Care – PPO | Admitting: Physician Assistant

## 2020-06-01 VITALS — BP 146/90 | HR 67 | Ht 68.0 in | Wt 265.0 lb

## 2020-06-01 DIAGNOSIS — R3129 Other microscopic hematuria: Secondary | ICD-10-CM

## 2020-06-02 LAB — URINALYSIS, COMPLETE
Bilirubin, UA: NEGATIVE
Glucose, UA: NEGATIVE
Ketones, UA: NEGATIVE
Leukocytes,UA: NEGATIVE
Nitrite, UA: NEGATIVE
Protein,UA: NEGATIVE
Specific Gravity, UA: 1.025 (ref 1.005–1.030)
Urobilinogen, Ur: 0.2 mg/dL (ref 0.2–1.0)
pH, UA: 5.5 (ref 5.0–7.5)

## 2020-06-02 LAB — MICROSCOPIC EXAMINATION: Bacteria, UA: NONE SEEN

## 2020-06-04 NOTE — Progress Notes (Signed)
 06/01/2020 1:24 PM   Cameron Randall 02/10/1956 [id number]  CC: Chief Complaint  Patient presents with  . Nephrolithiasis   HPI: Cameron Randall is a 64 y.o. male with PMH nephrolithiasis who presents today for repeat UA.  He was seen in clinic most recently by Dr. Brandon on 05/04/2020 for follow-up after having passed 2 left ureteral stones.  He was noted to have microscopic hematuria at that time.  This was presumed associated with his recent stone episode and plans were made to repeat UA in 1 month.  Today he reports feeling well with resolution of flank pain.  He has no acute concerns today.  He is a never smoker but reports using chewing tobacco, 1 can weekly for 40 years.  He is concerned about the costs of his prior to noncontrast CT scans.  In-office UA today positive for 1+ blood; urine microscopy with 3-10 RBCs/HPF.  PMH: Past Medical History:  Diagnosis Date  . History of kidney stones   . Hyperlipidemia   . Hypertension   . Kidney stones     Surgical History: Past Surgical History:  Procedure Laterality Date  . BACK SURGERY    . CYSTOSCOPY WITH STENT PLACEMENT Right 01/22/2017   Procedure: CYSTOSCOPY WITH STENT PLACEMENT;  Surgeon: Brandon, Ashley, MD;  Location: [facility];  Service: Urology;  Laterality: Right;  . LITHOTRIPSY    . NECK SURGERY    . STONE EXTRACTION WITH BASKET Right 01/22/2017   Procedure: STONE EXTRACTION WITH BASKET;  Surgeon: Brandon, Ashley, MD;  Location: [facility];  Service: Urology;  Laterality: Right;  . URETEROSCOPY WITH HOLMIUM LASER LITHOTRIPSY Right 01/22/2017   Procedure: URETEROSCOPY WITH HOLMIUM LASER LITHOTRIPSY;  Surgeon: Brandon, Ashley, MD;  Location: [facility];  Service: Urology;  Laterality: Right;    Home Medications:  Allergies as of 06/01/2020   No Known Allergies     Medication List       Accurate as of June 01, 2020 11:59 PM. If you have any questions, ask your nurse or doctor.        carvedilol 25 MG  tablet Commonly known as: COREG Take 50 mg by mouth 2 (two) times daily.   colchicine 0.6 MG tablet Take 1 tablet (0.6 mg total) by mouth daily.   ibuprofen 800 MG tablet Commonly known as: ADVIL Take 1 tablet (800 mg total) by mouth 3 (three) times daily.   lisinopril-hydrochlorothiazide 20-25 MG tablet Commonly known as: ZESTORETIC Take 1 tablet by mouth daily.   methylPREDNISolone 4 MG Tbpk tablet Commonly known as: MEDROL DOSEPAK Take per package instructions.   pravastatin 80 MG tablet Commonly known as: PRAVACHOL Take 80 mg by mouth every evening.   tamsulosin 0.4 MG Caps capsule Commonly known as: Flomax Take 1 capsule (0.4 mg total) by mouth daily.       Allergies:  No Known Allergies  Family History: No family history on file.  Social History:   reports that he has never smoked. His smokeless tobacco use includes snuff. He reports that he does not drink alcohol and does not use drugs.  Physical Exam: BP (!) 146/90   Pulse 67   Ht 5' 8" (1.727 m)   Wt 265 lb (120.2 kg)   BMI 40.29 kg/m   Constitutional:  Alert and oriented, no acute distress, nontoxic appearing HEENT: [place], AT Cardiovascular: No clubbing, cyanosis, or edema Respiratory: Normal respiratory effort, no increased work of breathing Skin: No rashes, bruises or suspicious lesions Neurologic: Grossly intact,   no focal deficits, moving all 4 extremities Psychiatric: Normal mood and affect  Laboratory Data: Results for orders placed or performed in visit on 06/01/20  Microscopic Examination   Urine  Result Value Ref Range   WBC, UA 0-5 0 - 5 /hpf   RBC 3-10 (A) 0 - 2 /hpf   Epithelial Cells (non renal) 0-10 0 - 10 /hpf   Casts Present (A) None seen /lpf   Cast Type Hyaline casts N/A   Bacteria, UA None seen None seen/Few  Urinalysis, Complete  Result Value Ref Range   Specific Gravity, UA 1.025 1.005 - 1.030   pH, UA 5.5 5.0 - 7.5   Color, UA Yellow Yellow   Appearance Ur Clear Clear    Leukocytes,UA Negative Negative   Protein,UA Negative Negative/Trace   Glucose, UA Negative Negative   Ketones, UA Negative Negative   RBC, UA 1+ (A) Negative   Bilirubin, UA Negative Negative   Urobilinogen, Ur 0.2 0.2 - 1.0 mg/dL   Nitrite, UA Negative Negative   Microscopic Examination See below:    Assessment & Plan:   1. Microscopic hematuria Persistent on UA today.  Possibly associated with nephrolithiasis, however cannot rule out other causes especially with his history of tobacco use.  Patient is considered high risk per AUA guidelines by age.    I had a lengthy conversation today with the patient regarding the etiology of blood in the urine.  I explained that blood in the urine can be caused by a myriad of factors, including but not limited to infection, stones, cysts, anticoagulation, and urinary tract malignancies.  I explained that the recommended work-up for blood in the urine is twofold and includes a CT urogram for evaluation of the upper urinary tract including kidneys and ureters as well as a cystoscopy for evaluation of the urethra and bladder.  I explained that these two studies complement one another in reviewing the entire urinary tract for possible causes of bleeding.  I recommended that we proceed with this at this time.  Patient agreed; CTU ordered and follow-up cysto with CTU results scheduled. - Urinalysis, Complete - CT HEMATURIA WORKUP; Future  Return in about 4 weeks (around 06/29/2020) for Cysto and CTU results with Dr. Brandon.  Samantha Vaillancourt, PA-C  [facility] [address], [address] [place], [place] [address] [phone]    

## 2020-06-15 ENCOUNTER — Ambulatory Visit: Admission: RE | Admit: 2020-06-15 | Payer: BC Managed Care – PPO | Source: Ambulatory Visit

## 2020-06-24 ENCOUNTER — Telehealth: Payer: Self-pay | Admitting: *Deleted

## 2020-06-24 NOTE — Telephone Encounter (Signed)
Called patient to discuss getting CT completed prior to his upcoming appointment on 06/29/20-he did not want to get CT scan due to costs and recent imaging done on 04/23/20. Would like to proceed with Cysto if necessary. Please advise.

## 2020-06-24 NOTE — Telephone Encounter (Signed)
Because the CT scan was done without contrast on his previous imaging, kidney tumors and ureteral masses can be missed. It is an incomplete study.  He is at high risk for these things because of his smoking history.  At minimum, I would recommend that he have his cystoscopy but would also prefer him to the CT scan as well. Alternatively, we can do the cystoscopy and inject contrast from below with x-rays in the operating room as well as a renal ultrasound as an alternative.  Happy to discuss this further with him at his follow-up visit.  Vanna Scotland, MD

## 2020-06-25 NOTE — Telephone Encounter (Signed)
Patient notified-would like to discuss further in detail at his follow up appointment.

## 2020-06-29 ENCOUNTER — Other Ambulatory Visit: Payer: Self-pay

## 2020-06-29 ENCOUNTER — Telehealth: Payer: Self-pay | Admitting: Radiology

## 2020-06-29 ENCOUNTER — Other Ambulatory Visit: Payer: Self-pay | Admitting: Radiology

## 2020-06-29 ENCOUNTER — Ambulatory Visit: Payer: BC Managed Care – PPO | Admitting: Urology

## 2020-06-29 VITALS — BP 158/92 | HR 70 | Wt 265.0 lb

## 2020-06-29 DIAGNOSIS — N200 Calculus of kidney: Secondary | ICD-10-CM

## 2020-06-29 DIAGNOSIS — R3129 Other microscopic hematuria: Secondary | ICD-10-CM

## 2020-06-29 DIAGNOSIS — N471 Phimosis: Secondary | ICD-10-CM

## 2020-06-29 LAB — MICROSCOPIC EXAMINATION: Bacteria, UA: NONE SEEN

## 2020-06-29 LAB — URINALYSIS, COMPLETE
Bilirubin, UA: NEGATIVE
Glucose, UA: NEGATIVE
Ketones, UA: NEGATIVE
Leukocytes,UA: NEGATIVE
Nitrite, UA: NEGATIVE
Protein,UA: NEGATIVE
Specific Gravity, UA: 1.025 (ref 1.005–1.030)
Urobilinogen, Ur: 0.2 mg/dL (ref 0.2–1.0)
pH, UA: 6 (ref 5.0–7.5)

## 2020-06-29 MED ORDER — NYSTATIN-TRIAMCINOLONE 100000-0.1 UNIT/GM-% EX OINT: 1.0000 "application " | TOPICAL_OINTMENT | Freq: Two times a day (BID) | CUTANEOUS | 0 refills | Status: DC

## 2020-06-29 NOTE — H&P (View-Only) (Signed)
 06/29/20  CC:  Chief Complaint  Patient presents with  . Nephrolithiasis    HPI: 64-year-old male with persistent microscopic hematuria who presents today for cystoscopy.  He previously underwent a noncontrast CT scan indicating to left sided UVJ stones which he subsequently passed. He also has a nonobstructing 17 mm stone.  He reports that he had been scheduled for CT urogram and was planning to undergo the procedure however received a phone call indicating that he needed to pay at least $800 in advance. He did not have the funds to do this. Elected not to pursue this.  He reports that he has intermittent left flank pain especially after riding his tractor. He believes that this is a stone knocking around. He may be interested in treatment for this. He does have a personal history of kidney stones requiring surgical intervention in the form of ureteroscopy.  He also mentions today that he has been having issues with his foreskin. He got it cauterized it for about 4 years ago and since then, the foreskin has become inflamed and is tightly tightening. He is not able to retract it fully.  Blood pressure (!) 158/92, pulse 70, weight 265 lb (120.2 kg). NED. A&Ox3.   No respiratory distress   Abd soft, NT, ND Normal phallus with bilateral descended testicles Foreskin difficult to retract, inflamed in appearance  Cystoscopy Procedure Note  Patient identification was confirmed, informed consent was obtained, and patient was prepped using Betadine solution.  Lidocaine jelly was administered per urethral meatus.     Pre-Procedure: - Inspection reveals a normal caliber ureteral meatus.  Procedure: The flexible cystoscope was introduced without difficulty - No urethral strictures/lesions are present. - Enlarged prostate with bilobar coaptation - Mildly elevated bladder neck - Bilateral ureteral orifices identifie - Bladder mucosa  reveals no ulcers, tumors, or lesions - No bladder  stones -Mild trabeculation  Retroflexion shows slight intravesical protrusion but no discrete median lobe   Post-Procedure: - Patient tolerated the procedure well  Assessment/ Plan:  1. Microscopic hematuria Cystoscopy today fairly unremarkable other than mild prostamegaly  Patient does have a known history of nephrolithiasis. His microscopic hematuria is persist despite interval passage of his ureteral calculi. This may be related to his larger nonobstructing stone.  Ultimately, he does not wish to pursue CT urogram. He may want to have the stone treated, see below. We discussed the option of bilateral retrograde pyelogram and follow-up renal ultrasound to complete his hematuria evaluation. He is agreeable this plan.  - Urinalysis, Complete - CULTURE, URINE COMPREHENSIVE  2. Phimosis Personal history of for significant worsening phimosis  He mention today that he might be interested in circumcision. We can potentially do this at the same time of stone surgery. We discussed the risk and benefits including risk of bleeding, infection, impaired cosmesis as well as the postoperative course.  He like to try topical antifungal steroid cream for the next month and see how it goes. If he fails to improve, he will plan on circumcision at the time of ureteroscopy  3. Left renal stone Nonobstructing and intermittently symptomatic 17 mm nonobstructing stone  Given the size of the stone is unlikely to pass spontaneously  We discussed various treatment options for urolithiasis including observation with or without medical expulsive therapy, shockwave lithotripsy (SWL), ureteroscopy and laser lithotripsy with stent placement, and percutaneous nephrolithotomy.   We discussed that management is based on stone size, location, density, patient co-morbidities, and patient preference.    Stones <5mm   in size have a >80% spontaneous passage rate. Data surrounding the use of tamsulosin for medical  expulsive therapy is controversial, but meta analyses suggests it is most efficacious for distal stones between 5-110mm in size. Possible side effects include dizziness/lightheadedness, and retrograde ejaculation.   SWL has a lower stone free rate in a single procedure, but also a lower complication rate compared to ureteroscopy and avoids a stent and associated stent related symptoms. Possible complications include renal hematoma, steinstrasse, and need for additional treatment. We discussed the role of his increased skin to stone distance can lead to decreased efficacy with shockwave lithotripsy.   Ureteroscopy with laser lithotripsy and stent placement has a higher stone free rate than SWL in a single procedure, however increased complication rate including possible infection, ureteral injury, bleeding, and stent related morbidity. Common stent related symptoms include dysuria, urgency/frequency, and flank pain.   After an extensive discussion of the risks and benefits of the above treatment options, the patient would like to proceed with ureteroscopy.  Preoperative urine culture   Vanna Scotland, MD

## 2020-06-29 NOTE — Progress Notes (Signed)
06/29/20  CC:  Chief Complaint  Patient presents with  . Nephrolithiasis    HPI: 64 year old male with persistent microscopic hematuria who presents today for cystoscopy.  He previously underwent a noncontrast CT scan indicating to left sided UVJ stones which he subsequently passed. He also has a nonobstructing 17 mm stone.  He reports that he had been scheduled for CT urogram and was planning to undergo the procedure however received a phone call indicating that he needed to pay at least $800 in advance. He did not have the funds to do this. Elected not to pursue this.  He reports that he has intermittent left flank pain especially after riding his tractor. He believes that this is a stone knocking around. He may be interested in treatment for this. He does have a personal history of kidney stones requiring surgical intervention in the form of ureteroscopy.  He also mentions today that he has been having issues with his foreskin. He got it cauterized it for about 4 years ago and since then, the foreskin has become inflamed and is tightly tightening. He is not able to retract it fully.  Blood pressure (!) 158/92, pulse 70, weight 265 lb (120.2 kg). NED. A&Ox3.   No respiratory distress   Abd soft, NT, ND Normal phallus with bilateral descended testicles Foreskin difficult to retract, inflamed in appearance  Cystoscopy Procedure Note  Patient identification was confirmed, informed consent was obtained, and patient was prepped using Betadine solution.  Lidocaine jelly was administered per urethral meatus.     Pre-Procedure: - Inspection reveals a normal caliber ureteral meatus.  Procedure: The flexible cystoscope was introduced without difficulty - No urethral strictures/lesions are present. - Enlarged prostate with bilobar coaptation - Mildly elevated bladder neck - Bilateral ureteral orifices identifie - Bladder mucosa  reveals no ulcers, tumors, or lesions - No bladder  stones -Mild trabeculation  Retroflexion shows slight intravesical protrusion but no discrete median lobe   Post-Procedure: - Patient tolerated the procedure well  Assessment/ Plan:  1. Microscopic hematuria Cystoscopy today fairly unremarkable other than mild prostamegaly  Patient does have a known history of nephrolithiasis. His microscopic hematuria is persist despite interval passage of his ureteral calculi. This may be related to his larger nonobstructing stone.  Ultimately, he does not wish to pursue CT urogram. He may want to have the stone treated, see below. We discussed the option of bilateral retrograde pyelogram and follow-up renal ultrasound to complete his hematuria evaluation. He is agreeable this plan.  - Urinalysis, Complete - CULTURE, URINE COMPREHENSIVE  2. Phimosis Personal history of for significant worsening phimosis  He mention today that he might be interested in circumcision. We can potentially do this at the same time of stone surgery. We discussed the risk and benefits including risk of bleeding, infection, impaired cosmesis as well as the postoperative course.  He like to try topical antifungal steroid cream for the next month and see how it goes. If he fails to improve, he will plan on circumcision at the time of ureteroscopy  3. Left renal stone Nonobstructing and intermittently symptomatic 17 mm nonobstructing stone  Given the size of the stone is unlikely to pass spontaneously  We discussed various treatment options for urolithiasis including observation with or without medical expulsive therapy, shockwave lithotripsy (SWL), ureteroscopy and laser lithotripsy with stent placement, and percutaneous nephrolithotomy.   We discussed that management is based on stone size, location, density, patient co-morbidities, and patient preference.    Stones <55mm  in size have a >80% spontaneous passage rate. Data surrounding the use of tamsulosin for medical  expulsive therapy is controversial, but meta analyses suggests it is most efficacious for distal stones between 5-110mm in size. Possible side effects include dizziness/lightheadedness, and retrograde ejaculation.   SWL has a lower stone free rate in a single procedure, but also a lower complication rate compared to ureteroscopy and avoids a stent and associated stent related symptoms. Possible complications include renal hematoma, steinstrasse, and need for additional treatment. We discussed the role of his increased skin to stone distance can lead to decreased efficacy with shockwave lithotripsy.   Ureteroscopy with laser lithotripsy and stent placement has a higher stone free rate than SWL in a single procedure, however increased complication rate including possible infection, ureteral injury, bleeding, and stent related morbidity. Common stent related symptoms include dysuria, urgency/frequency, and flank pain.   After an extensive discussion of the risks and benefits of the above treatment options, the patient would like to proceed with ureteroscopy.  Preoperative urine culture   Vanna Scotland, MD

## 2020-06-29 NOTE — Telephone Encounter (Signed)
LMOM to return call. Need to discuss upcoming surgery with Dr Brandon. 

## 2020-07-01 LAB — CULTURE, URINE COMPREHENSIVE

## 2020-07-06 ENCOUNTER — Encounter: Payer: Self-pay | Admitting: Urology

## 2020-07-06 ENCOUNTER — Encounter
Admission: RE | Admit: 2020-07-06 | Discharge: 2020-07-06 | Disposition: A | Discharge status: Home / Self Care | Payer: BC Managed Care – PPO | Source: Ambulatory Visit | Attending: Urology | Admitting: Urology

## 2020-07-06 ENCOUNTER — Other Ambulatory Visit: Payer: Self-pay

## 2020-07-06 NOTE — Patient Instructions (Addendum)
Your procedure is scheduled on:07-12-20 MONDAY Report to the Registration Desk on the 1st floor of the [facility]. To find out your arrival time, please call [phone] between 1PM - 3PM on:07-09-20 FRIDAY  REMEMBER: Instructions that are not followed completely may result in serious medical risk, up to and including death; or upon the discretion of your surgeon and anesthesiologist your surgery may need to be rescheduled.  Do not eat food after midnight the night before surgery.  No gum chewing, lozengers or hard candies.  You may however, drink CLEAR liquids up to 2 hours before you are scheduled to arrive for your surgery. Do not drink anything within 2 hours of your scheduled arrival time.  Clear liquids include: - water  - apple juice without pulp - gatorade (not RED, PURPLE, OR BLUE) - black coffee or tea (Do NOT add milk or creamers to the coffee or tea) Do NOT drink anything that is not on this list.  TAKE THESE MEDICATIONS THE MORNING OF SURGERY WITH A SIP OF WATER: -COREG (CARVEDILOL) -ZYRTEC (CETIRIZINE)  One week prior to surgery: Stop Anti-inflammatories (NSAIDS) such as Advil, Aleve, Ibuprofen, Motrin, Naproxen, Naprosyn and Aspirin based products such as Excedrin, Goodys Powder, BC Powder-OK TO TAKE TYLENOL IF NEEDED  Stop ANY OVER THE COUNTER supplements until after surgery-STOP TART CHERRY NOW-YOU MAY RESUME AFTER SURGERY  No Alcohol for 24 hours before or after surgery.  No Smoking including e-cigarettes for 24 hours prior to surgery.  No chewable tobacco products for at least 6 hours prior to surgery.  No nicotine patches on the day of surgery.  Do not use any "recreational" drugs for at least a week prior to your surgery.  Please be advised that the combination of cocaine and anesthesia may have negative outcomes, up to and including death. If you test positive for cocaine, your surgery will be cancelled.  On the morning of surgery brush your teeth with  toothpaste and water, you may rinse your mouth with mouthwash if you wish. Do not swallow any toothpaste or mouthwash.  Do not wear jewelry, make-up, hairpins, clips or nail polish.  Do not wear lotions, powders, or perfumes.   Do not shave body from the neck down 48 hours prior to surgery just in case you cut yourself which could leave a site for infection.  Also, freshly shaved skin may become irritated if using the CHG soap.  Contact lenses, hearing aids and dentures may not be worn into surgery.  Do not bring valuables to the hospital. [facility] is not responsible for any missing/lost belongings or valuables.   Notify your doctor if there is any change in your medical condition (cold, fever, infection).  Wear comfortable clothing (specific to your surgery type) to the hospital.  Plan for stool softeners for home use; pain medications have a tendency to cause constipation. You can also help prevent constipation by eating foods high in fiber such as fruits and vegetables and drinking plenty of fluids as your diet allows.  After surgery, you can help prevent lung complications by doing breathing exercises.  Take deep breaths and cough every 1-2 hours. Your doctor may order a device called an Incentive Spirometer to help you take deep breaths. When coughing or sneezing, hold a pillow firmly against your incision with both hands. This is called "splinting." Doing this helps protect your incision. It also decreases belly discomfort.  If you are being admitted to the hospital overnight, leave your suitcase in the car.  After surgery it may be brought to your room.  If you are being discharged the day of surgery, you will not be allowed to drive home. You will need a responsible adult (18 years or older) to drive you home and stay with you that night.   If you are taking public transportation, you will need to have a responsible adult (18 years or older) with you. Please confirm with  your physician that it is acceptable to use public transportation.   Please call the Pre-admissions Testing Dept. at [phone] if you have any questions about these instructions.  Visitation Policy:  Patients undergoing a surgery or procedure may have one family member or support person with them as long as that person is not COVID-19 positive or experiencing its symptoms.  That person may remain in the waiting area during the procedure.  Inpatient Visitation Update:   In an effort to ensure the safety of our team members and our patients, we are implementing a change to our visitation policy:  Effective Monday, Aug. 9, at 7 a.m., inpatients will be allowed one support person.  o The support person may change daily.  o The support person must pass our screening, gel in and out, and wear a mask at all times, including in the patient's room.  o Patients must also wear a mask when staff or their support person are in the room.  o Masking is required regardless of vaccination status.  Systemwide, no visitors 17 or younger.

## 2020-07-06 NOTE — Pre-Procedure Instructions (Signed)
ECG 12-lead  Component 2 yr ago  Vent Rate (bpm)  68     PR Interval (msec)  222     QRS Interval (msec)  80     QT Interval (msec)  420     QTc (msec)  446     Narrative  Sinus rhythm 1st degree AV block  Otherwise normal ECG  No previous ECGs available  I reviewed and concur with this report. Electronically signed [id number] MD, MARSHALL [phone]) on 03/05/2018 5:18:35 PM Other Result Text  This result has an attachment that is not available. Specimen Collected: --  Date: 02/14/18  Received From: [facility]   Encounter Summary

## 2020-07-08 ENCOUNTER — Other Ambulatory Visit: Payer: BC Managed Care – PPO

## 2020-07-08 ENCOUNTER — Other Ambulatory Visit: Payer: Self-pay

## 2020-07-08 ENCOUNTER — Encounter
Admission: RE | Admit: 2020-07-08 | Discharge: 2020-07-08 | Disposition: A | Discharge status: Home / Self Care | Payer: BC Managed Care – PPO | Source: Ambulatory Visit | Attending: Urology | Admitting: Urology

## 2020-07-08 DIAGNOSIS — I10 Essential (primary) hypertension: Secondary | ICD-10-CM | POA: Insufficient documentation

## 2020-07-08 DIAGNOSIS — Z01818 Encounter for other preprocedural examination: Secondary | ICD-10-CM | POA: Diagnosis present

## 2020-07-08 DIAGNOSIS — Z20822 Contact with and (suspected) exposure to covid-19: Secondary | ICD-10-CM | POA: Insufficient documentation

## 2020-07-08 LAB — POTASSIUM: Potassium: 3.5 mmol/L (ref 3.5–5.1)

## 2020-07-09 LAB — SARS CORONAVIRUS 2 (TAT 6-24 HRS): SARS Coronavirus 2: NEGATIVE

## 2020-07-11 MED ORDER — ORAL CARE MOUTH RINSE: 15.0000 mL | Freq: Once | OROMUCOSAL | Status: AC

## 2020-07-11 MED ORDER — LACTATED RINGERS IV SOLN: INTRAVENOUS | Status: DC

## 2020-07-11 MED ORDER — CEFAZOLIN SODIUM-DEXTROSE 2-4 GM/100ML-% IV SOLN
2.0000 g | INTRAVENOUS | Status: AC
  Administered 2020-07-12: 2 g via INTRAVENOUS

## 2020-07-11 MED ORDER — CHLORHEXIDINE GLUCONATE 0.12 % MT SOLN: 15.0000 mL | Freq: Once | OROMUCOSAL | Status: AC

## 2020-07-11 MED ORDER — FAMOTIDINE 20 MG PO TABS: 20.0000 mg | ORAL_TABLET | Freq: Once | ORAL | Status: AC

## 2020-07-12 ENCOUNTER — Ambulatory Visit: Payer: BC Managed Care – PPO | Admitting: Certified Registered"

## 2020-07-12 ENCOUNTER — Encounter: Payer: Self-pay | Admitting: Urology

## 2020-07-12 ENCOUNTER — Ambulatory Visit: Payer: BC Managed Care – PPO

## 2020-07-12 ENCOUNTER — Other Ambulatory Visit: Payer: Self-pay

## 2020-07-12 ENCOUNTER — Ambulatory Visit
Admission: RE | Admit: 2020-07-12 | Discharge: 2020-07-12 | Disposition: A | Discharge status: Home / Self Care | Payer: BC Managed Care – PPO | Attending: Urology | Admitting: Urology

## 2020-07-12 ENCOUNTER — Encounter
Admission: RE | Disposition: A | Discharge status: Home / Self Care | Payer: Self-pay | Source: Home / Self Care | Attending: Urology

## 2020-07-12 DIAGNOSIS — N200 Calculus of kidney: Secondary | ICD-10-CM

## 2020-07-12 DIAGNOSIS — R3129 Other microscopic hematuria: Secondary | ICD-10-CM | POA: Diagnosis not present

## 2020-07-12 DIAGNOSIS — N471 Phimosis: Secondary | ICD-10-CM | POA: Diagnosis not present

## 2020-07-12 DIAGNOSIS — Z87442 Personal history of urinary calculi: Secondary | ICD-10-CM | POA: Diagnosis not present

## 2020-07-12 HISTORY — PX: CYSTOSCOPY W/ RETROGRADES: SHX1426

## 2020-07-12 HISTORY — PX: CIRCUMCISION: SHX1350

## 2020-07-12 HISTORY — PX: CYSTOSCOPY/URETEROSCOPY/HOLMIUM LASER/STENT PLACEMENT: SHX6546

## 2020-07-12 SURGERY — CIRCUMCISION, ADULT
Anesthesia: General

## 2020-07-12 MED ORDER — MIDAZOLAM HCL 2 MG/2ML IJ SOLN
INTRAMUSCULAR | Status: DC | PRN
  Administered 2020-07-12: 2 mg via INTRAVENOUS

## 2020-07-12 MED ORDER — IOHEXOL 180 MG/ML  SOLN
INTRAMUSCULAR | Status: DC | PRN
  Administered 2020-07-12: 40 mL

## 2020-07-12 MED ORDER — FENTANYL CITRATE (PF) 100 MCG/2ML IJ SOLN
INTRAMUSCULAR | Status: AC
  Administered 2020-07-12: 50 ug via INTRAVENOUS
  Filled 2020-07-12: qty 2

## 2020-07-12 MED ORDER — SEVOFLURANE IN SOLN
RESPIRATORY_TRACT | Status: AC
  Filled 2020-07-12: qty 250

## 2020-07-12 MED ORDER — CHLORHEXIDINE GLUCONATE 0.12 % MT SOLN
OROMUCOSAL | Status: AC
  Administered 2020-07-12: 15 mL via OROMUCOSAL
  Filled 2020-07-12: qty 15

## 2020-07-12 MED ORDER — LIDOCAINE HCL (CARDIAC) PF 100 MG/5ML IV SOSY
PREFILLED_SYRINGE | INTRAVENOUS | Status: DC | PRN
  Administered 2020-07-12: 80 mg via INTRAVENOUS

## 2020-07-12 MED ORDER — ONDANSETRON HCL 4 MG/2ML IJ SOLN
INTRAMUSCULAR | Status: AC
  Filled 2020-07-12: qty 2

## 2020-07-12 MED ORDER — BACITRACIN ZINC 500 UNIT/GM EX OINT
TOPICAL_OINTMENT | CUTANEOUS | Status: AC
  Filled 2020-07-12: qty 28.35

## 2020-07-12 MED ORDER — FENTANYL CITRATE (PF) 100 MCG/2ML IJ SOLN
INTRAMUSCULAR | Status: AC
  Filled 2020-07-12: qty 2

## 2020-07-12 MED ORDER — OXYCODONE-ACETAMINOPHEN 5-325 MG PO TABS
1.0000 | ORAL_TABLET | ORAL | 0 refills | Status: DC | PRN
Start: 2020-07-12 — End: 2020-08-24

## 2020-07-12 MED ORDER — LIDOCAINE HCL (PF) 2 % IJ SOLN
INTRAMUSCULAR | Status: AC
  Filled 2020-07-12: qty 5

## 2020-07-12 MED ORDER — PROMETHAZINE HCL 25 MG/ML IJ SOLN: 6.2500 mg | INTRAMUSCULAR | Status: DC | PRN

## 2020-07-12 MED ORDER — FENTANYL CITRATE (PF) 100 MCG/2ML IJ SOLN
INTRAMUSCULAR | Status: DC | PRN
  Administered 2020-07-12 (×2): 50 ug via INTRAVENOUS

## 2020-07-12 MED ORDER — PHENYLEPHRINE HCL (PRESSORS) 10 MG/ML IV SOLN
INTRAVENOUS | Status: DC | PRN
  Administered 2020-07-12 (×2): 100 ug via INTRAVENOUS

## 2020-07-12 MED ORDER — OXYBUTYNIN CHLORIDE 5 MG PO TABS: 5.0000 mg | ORAL_TABLET | Freq: Three times a day (TID) | ORAL | 0 refills | Status: DC | PRN

## 2020-07-12 MED ORDER — MEPERIDINE HCL 50 MG/ML IJ SOLN: 6.2500 mg | INTRAMUSCULAR | Status: DC | PRN

## 2020-07-12 MED ORDER — MIDAZOLAM HCL 2 MG/2ML IJ SOLN
INTRAMUSCULAR | Status: AC
  Filled 2020-07-12: qty 2

## 2020-07-12 MED ORDER — ONDANSETRON HCL 4 MG/2ML IJ SOLN
INTRAMUSCULAR | Status: DC | PRN
  Administered 2020-07-12: 4 mg via INTRAVENOUS

## 2020-07-12 MED ORDER — LIDOCAINE 1% INJECTION FOR CIRCUMCISION
INJECTION | INTRAVENOUS | Status: DC | PRN
  Administered 2020-07-12: 20 mL via SUBCUTANEOUS

## 2020-07-12 MED ORDER — CEFAZOLIN SODIUM-DEXTROSE 2-4 GM/100ML-% IV SOLN
INTRAVENOUS | Status: AC
  Filled 2020-07-12: qty 100

## 2020-07-12 MED ORDER — FENTANYL CITRATE (PF) 100 MCG/2ML IJ SOLN
25.0000 ug | INTRAMUSCULAR | Status: DC | PRN
  Administered 2020-07-12: 50 ug via INTRAVENOUS

## 2020-07-12 MED ORDER — PROPOFOL 10 MG/ML IV BOLUS
INTRAVENOUS | Status: AC
  Filled 2020-07-12: qty 20

## 2020-07-12 MED ORDER — PROPOFOL 10 MG/ML IV BOLUS
INTRAVENOUS | Status: DC | PRN
  Administered 2020-07-12: 150 mg via INTRAVENOUS

## 2020-07-12 MED ORDER — CARVEDILOL 25 MG PO TABS
ORAL_TABLET | ORAL | Status: AC
  Filled 2020-07-12: qty 1

## 2020-07-12 MED ORDER — FAMOTIDINE 20 MG PO TABS
ORAL_TABLET | ORAL | Status: AC
  Administered 2020-07-12: 20 mg via ORAL
  Filled 2020-07-12: qty 1

## 2020-07-12 MED ORDER — PHENYLEPHRINE HCL (PRESSORS) 10 MG/ML IV SOLN
INTRAVENOUS | Status: AC
  Filled 2020-07-12: qty 1

## 2020-07-12 MED ORDER — DEXAMETHASONE SODIUM PHOSPHATE 10 MG/ML IJ SOLN
INTRAMUSCULAR | Status: DC | PRN
  Administered 2020-07-12: 5 mg via INTRAVENOUS

## 2020-07-12 MED ORDER — SUGAMMADEX SODIUM 200 MG/2ML IV SOLN
INTRAVENOUS | Status: DC | PRN
  Administered 2020-07-12: 200 mg via INTRAVENOUS

## 2020-07-12 MED ORDER — ROCURONIUM BROMIDE 100 MG/10ML IV SOLN
INTRAVENOUS | Status: DC | PRN
  Administered 2020-07-12: 50 mg via INTRAVENOUS
  Administered 2020-07-12: 10 mg via INTRAVENOUS

## 2020-07-12 MED ORDER — DEXAMETHASONE SODIUM PHOSPHATE 10 MG/ML IJ SOLN
INTRAMUSCULAR | Status: AC
  Filled 2020-07-12: qty 1

## 2020-07-12 MED ORDER — ACETAMINOPHEN 10 MG/ML IV SOLN
INTRAVENOUS | Status: AC
  Filled 2020-07-12: qty 100

## 2020-07-12 MED ORDER — ACETAMINOPHEN 10 MG/ML IV SOLN
INTRAVENOUS | Status: DC | PRN
  Administered 2020-07-12: 1000 mg via INTRAVENOUS

## 2020-07-12 MED ORDER — OXYCODONE HCL 5 MG/5ML PO SOLN: 5.0000 mg | Freq: Once | ORAL | Status: DC | PRN

## 2020-07-12 MED ORDER — CARVEDILOL 25 MG PO TABS
25.0000 mg | ORAL_TABLET | Freq: Once | ORAL | Status: AC
  Administered 2020-07-12: 25 mg via ORAL

## 2020-07-12 MED ORDER — OXYCODONE HCL 5 MG PO TABS: 5.0000 mg | ORAL_TABLET | Freq: Once | ORAL | Status: DC | PRN

## 2020-07-12 MED ORDER — BACITRACIN ZINC 500 UNIT/GM EX OINT
TOPICAL_OINTMENT | CUTANEOUS | Status: DC | PRN
  Administered 2020-07-12: 1 via TOPICAL

## 2020-07-12 MED ORDER — ROCURONIUM BROMIDE 10 MG/ML (PF) SYRINGE
PREFILLED_SYRINGE | INTRAVENOUS | Status: AC
  Filled 2020-07-12: qty 10

## 2020-07-12 SURGICAL SUPPLY — 55 items
APL PRP STRL LF DISP 70% ISPRP (MISCELLANEOUS) ×3
BAG DRAIN CYSTO-URO LG1000N (MISCELLANEOUS) ×5 IMPLANT
BASKET ZERO TIP 1.9FR (BASKET) ×5 IMPLANT
BLADE CLIPPER SURG (BLADE) IMPLANT
BLADE SURG 15 STRL LF DISP TIS (BLADE) ×3 IMPLANT
BLADE SURG 15 STRL SS (BLADE) ×5
BNDG COHESIVE 1X5 TAN NS LF (GAUZE/BANDAGES/DRESSINGS) IMPLANT
BNDG CONFORM 2 STRL LF (GAUZE/BANDAGES/DRESSINGS) ×5 IMPLANT
BRUSH SCRUB EZ 1% IODOPHOR (MISCELLANEOUS) ×5 IMPLANT
BSKT STON RTRVL ZERO TP 1.9FR (BASKET) ×3
CANISTER SUCT 1200ML W/VALVE (MISCELLANEOUS) ×5 IMPLANT
CATH URET FLEX-TIP 2 LUMEN 10F (CATHETERS) ×5 IMPLANT
CATH URETL 5X70 OPEN END (CATHETERS) ×5 IMPLANT
CHLORAPREP W/TINT 26 (MISCELLANEOUS) ×5 IMPLANT
CNTNR SPEC 2.5X3XGRAD LEK (MISCELLANEOUS) ×3
CONT SPEC 4OZ STER OR WHT (MISCELLANEOUS) ×2
CONT SPEC 4OZ STRL OR WHT (MISCELLANEOUS) ×3
CONTAINER SPEC 2.5X3XGRAD LEK (MISCELLANEOUS) ×3 IMPLANT
COVER WAND RF STERILE (DRAPES) ×5 IMPLANT
DRAPE LAPAROTOMY 77X122 PED (DRAPES) ×5 IMPLANT
DRAPE UTILITY 15X26 TOWEL STRL (DRAPES) ×5 IMPLANT
ELECT REM PT RETURN 9FT ADLT (ELECTROSURGICAL) ×5
ELECTRODE REM PT RTRN 9FT ADLT (ELECTROSURGICAL) ×3 IMPLANT
GAUZE PETROLATUM 1 X8 (GAUZE/BANDAGES/DRESSINGS) ×5 IMPLANT
GLOVE BIO SURGEON STRL SZ 6.5 (GLOVE) ×4 IMPLANT
GLOVE BIO SURGEONS STRL SZ 6.5 (GLOVE) ×1
GOWN STRL REUS W/ TWL LRG LVL3 (GOWN DISPOSABLE) ×6 IMPLANT
GOWN STRL REUS W/TWL LRG LVL3 (GOWN DISPOSABLE) ×10
GUIDEWIRE GREEN .038 145CM (MISCELLANEOUS) ×5 IMPLANT
GUIDEWIRE STR DUAL SENSOR (WIRE) ×5 IMPLANT
INFUSOR MANOMETER BAG 3000ML (MISCELLANEOUS) ×5 IMPLANT
INTRODUCER DILATOR DOUBLE (INTRODUCER) IMPLANT
KIT TURNOVER CYSTO (KITS) ×5 IMPLANT
KIT TURNOVER KIT A (KITS) ×5 IMPLANT
LABEL OR SOLS (LABEL) ×5 IMPLANT
MANIFOLD NEPTUNE II (INSTRUMENTS) ×5 IMPLANT
NEEDLE HYPO 25X1 1.5 SAFETY (NEEDLE) ×5 IMPLANT
NS IRRIG 500ML POUR BTL (IV SOLUTION) ×5 IMPLANT
PACK BASIN MINOR (MISCELLANEOUS) ×5 IMPLANT
PACK CYSTO AR (MISCELLANEOUS) ×5 IMPLANT
SET CYSTO W/LG BORE CLAMP LF (SET/KITS/TRAYS/PACK) ×5 IMPLANT
SHEATH URETERAL 12FR 45CM (SHEATH) ×5 IMPLANT
SHEATH URETERAL 12FRX35CM (MISCELLANEOUS) IMPLANT
SOL .9 NS 3000ML IRR  AL (IV SOLUTION) ×2
SOL .9 NS 3000ML IRR AL (IV SOLUTION) ×3
SOL .9 NS 3000ML IRR UROMATIC (IV SOLUTION) ×3 IMPLANT
SOL PREP PVP 2OZ (MISCELLANEOUS) ×5
SOLUTION PREP PVP 2OZ (MISCELLANEOUS) ×3 IMPLANT
STENT URET 6FRX24 CONTOUR (STENTS) IMPLANT
STENT URET 6FRX26 CONTOUR (STENTS) ×5 IMPLANT
SURGILUBE 2OZ TUBE FLIPTOP (MISCELLANEOUS) ×5 IMPLANT
SUT CHROMIC 3 0 SH 27 (SUTURE) ×10 IMPLANT
SYR 10ML LL (SYRINGE) ×5 IMPLANT
TRACTIP FLEXIVA PULSE ID 200 (Laser) ×5 IMPLANT
WATER STERILE IRR 1000ML POUR (IV SOLUTION) ×5 IMPLANT

## 2020-07-12 NOTE — Anesthesia Postprocedure Evaluation (Signed)
Anesthesia Post Note  Patient: Cameron Randall  Procedure(s) Performed: CIRCUMCISION ADULT (N/A ) CYSTOSCOPY/URETEROSCOPY/HOLMIUM LASER/STENT PLACEMENT (Left ) CYSTOSCOPY WITH RETROGRADE PYELOGRAM (Bilateral )  Patient location during evaluation: PACU Anesthesia Type: General Level of consciousness: awake and alert and oriented Pain management: pain level controlled Vital Signs Assessment: post-procedure vital signs reviewed and stable Respiratory status: spontaneous breathing, nonlabored ventilation and respiratory function stable Cardiovascular status: blood pressure returned to baseline and stable Postop Assessment: no signs of nausea or vomiting Anesthetic complications: no   No complications documented.   Last Vitals:  Vitals:   07/12/20 1431 07/12/20 1446  BP: 132/72 (!) 143/72  Pulse: 77 72  Resp: 13 14  Temp: (!) 36.2 C   SpO2: 100% 97%    Last Pain:  Vitals:   07/12/20 1431  TempSrc:   PainSc: Asleep                 Alfhild Partch

## 2020-07-12 NOTE — Op Note (Signed)
Date of procedure: 07/12/20  Preoperative diagnosis:  1. Microscopic hematuria 2. Left nephrolithiasis 3. Phimosis  Postoperative diagnosis:  1. Same as above  Procedure: 1. Cystoscopy 2. Bilateral retrograde pyelogram 3. Left ureteroscopy with laser lithotripsy 4. Basket extraction of stone fragment 5. Left ureteral stent placement 6. Interpretation of fluoroscopy less than 30 minutes 7. Circumcision 8. Lysis of penile bands/adhesions  Surgeon: Vanna Scotland, MD  Anesthesia: General  Complications: None  Intraoperative findings: 17 mm left midpole stone fragmented along with other smaller stones.  Very hard stone.  Collecting system visually cleared of all fragment greater than the tip of the laser fiber in size.  Bilateral retrograde unremarkable.  Stent left in place.    Circumcision with significantly diseased, adherent foreskin with penile band/adhesions requiring both blunt and sharp lysis.  EBL: Minimal  Specimens: Foreskin/stone fragment  Drains: 6 x 26 French double-J ureteral stent on left  Indication: Cameron Randall is a 64 y.o. patient with microscopic hematuria, symptomatic nonobstructing left sided stone measuring 17 mm and severe phimosis.  After reviewing the management options for treatment, he elected to proceed with the above surgical procedure(s). We have discussed the potential benefits and risks of the procedure, side effects of the proposed treatment, the likelihood of the patient achieving the goals of the procedure, and any potential problems that might occur during the procedure or recuperation. Informed consent has been obtained.  Description of procedure:  The patient was taken to the operating room and general anesthesia was induced.  The patient was placed in the dorsal lithotomy position, prepped and draped in the usual sterile fashion, and preoperative antibiotics were administered. A preoperative time-out was performed.   A 21 French scope  was advanced per urethra into the bladder.  Bladder is carefully inspected.  Is trabeculated but otherwise unremarkable.  Attention was turned to the right ureter orifice which was cannulated using it 5 [place] open-ended ureteral catheter just within the UO.  Gentle retrograde pyelogram was performed.  Initially, there is some concern for a UPJ filling defect however upon injection of further contrast allowing this to drain, it appeared that this is more consistent with a bubble as it did not persist.  Care was taken to inspect this area multiple times to ensure no concern for mass.  No hydroureteronephrosis or filling defects were appreciated otherwise.  Images were saved to PACS.  Attention was then turned to the left ureteral orifice.  5 [place] open-ended ureteral catheter was used to inject contrast which showed a decompressed left collecting system with a filling defect at the level of the known stone.  A sensor wire was then placed up to the level of the kidney.  A dual-lumen catheter was then used to advance a wire up to the level of the kidney.  A dual-lumen catheter was then used to introduce a second Super Stiff wire was used as a working wire.  The safety wire snapped in place.  A Cook 12/14 French 45 cm access sheath was then advanced under fluoroscopic guidance up to level the proximal ureter without difficulty over the Super Stiff wire.  The inner cannula was removed along with a wire.  A dual-lumen digital flexible ureteroscope was then brought in and advanced to the level of the stone.  273 m laser fiber was then brought in and using settings of 0.2 J and 40 Hz, the stone was fragmented over the course of 1+ hours.  It was very large in size and quite  hard.  They created multiple fragments which are extracted using a 1.9 French tipless nitinol basket.  The collecting system ultimately was able to be clear.  If his few additional stones were identified within the calyces of treated.  Ultimately,  contrast was injected to create a roadmap of the kidney to ensure that each every calyx was directly visualized.  There is no significant fragments greater than the tip of the size of the laser fiber.  There is no contrast extravasation.  The sheath was then removed visually inspecting the ureter along the way.  There were no ureteral injuries or additional fragments appreciated.  Finally, the safety wire was backloaded over rigid cystoscope.  A 6 x 26 French double-J ureteral stent was advanced over the wire up to the level of the kidney.  The wire was withdrawn until full coils noted both within the bladder as well as in the renal pelvis.  The bladder was then drained.  Next, the patient was repositioned in the supine position.  He was then reprepped and draped in the standard sterile fashion.  A dorsal penile block with 10 cc of 1% lidocaine as well as a ring block with 10 cc of lidocaine was performed.  I was unable to reduce the foreskin due to severe phimosis and thickened skin.  I then performed a dorsal slit by clamping the skin and incising with scissors in order to loosen the foreskin.  This allowed me to ultimately retract the foreskin which was cleaned using additional Betadine solution.  There is severe penile adhesions around the coronal margin which were taken down first bluntly and then a few significant bands were taken down sharply in order to create a nice coronal margin.  The foreskin was then incised into rings, 1 approximately 1 cm distal to the coronal margin as well as within the midshaft and the penile foreskin was removed in a sleevelike fashion.  Care was taken to ensure that all of the disease skin was removed.  Careful hemostasis was then achieved.  The frenulum was reconstructed using a series of interrupted 3-0 chromic sutures.  Next, U stitches placed at the frenulum to the ventral aspect of the shaft skin.  The remainder of the skin was interrupted using a series of 3-0 chromic  sutures in interrupted fashion.  Patient was then cleaned and dried.  Cosmesis was excellent.  The dressing of Vaseline gauze, conform and Coban was applied.  Additional bacitracin ointment was administered to the glans.  Finally, the patient was reversed from anesthesia and taken to the PACU in stable condition.  Plan: We will have him return to the office next week for cystoscopy, stent removal.  He will remove his own penile dressing in 2 days unless he is unable to void, then sooner as needed.  Vanna Scotland, M.D.

## 2020-07-12 NOTE — Transfer of Care (Signed)
Immediate Anesthesia Transfer of Care Note  Patient: Cameron Randall  Procedure(s) Performed: CIRCUMCISION ADULT (N/A ) CYSTOSCOPY/URETEROSCOPY/HOLMIUM LASER/STENT PLACEMENT (Left ) CYSTOSCOPY WITH RETROGRADE PYELOGRAM (Bilateral )  Patient Location: PACU  Anesthesia Type:General  Level of Consciousness: drowsy  Airway & Oxygen Therapy: Patient Spontanous Breathing and Patient connected to face mask oxygen  Post-op Assessment: Report given to RN  Post vital signs: stable  Last Vitals:  Vitals Value Taken Time  BP 132/72 07/12/20 1431  Temp    Pulse 80 07/12/20 1433  Resp 18 07/12/20 1433  SpO2 100 % 07/12/20 1433  Vitals shown include unvalidated device data.  Last Pain:  Vitals:   07/12/20 1431  TempSrc:   PainSc: (P) Asleep         Complications: No complications documented.

## 2020-07-12 NOTE — Anesthesia Preprocedure Evaluation (Signed)
Anesthesia Evaluation  Patient identified by MRN, date of birth, ID band Patient awake    Reviewed: Allergy & Precautions, NPO status , Patient's Chart, lab work & pertinent test results  History of Anesthesia Complications Negative for: history of anesthetic complications  Airway Mallampati: II  TM Distance: >3 FB Neck ROM: Full    Dental no notable dental hx.    Pulmonary neg pulmonary ROS, neg sleep apnea, neg COPD,    breath sounds clear to auscultation- rhonchi (-) wheezing      Cardiovascular hypertension, Pt. on medications (-) CAD, (-) Past MI, (-) Cardiac Stents and (-) CABG  Rhythm:Regular Rate:Normal - Systolic murmurs and - Diastolic murmurs    Neuro/Psych neg Seizures negative neurological ROS  negative psych ROS   GI/Hepatic negative GI ROS, Neg liver ROS,   Endo/Other  negative endocrine ROSneg diabetes  Renal/GU Renal disease: nephrolithiasis.     Musculoskeletal negative musculoskeletal ROS (+)   Abdominal (+) + obese,   Peds  Hematology negative hematology ROS (+)   Anesthesia Other Findings Past Medical History: No date: History of kidney stones No date: Hyperlipidemia No date: Hypertension No date: Kidney stones   Reproductive/Obstetrics                             Anesthesia Physical Anesthesia Plan  ASA: II  Anesthesia Plan: General   Post-op Pain Management:    Induction: Intravenous  PONV Risk Score and Plan: 1 and Dexamethasone and Ondansetron  Airway Management Planned: Oral ETT  Additional Equipment:   Intra-op Plan:   Post-operative Plan: Extubation in OR  Informed Consent: I have reviewed the patients History and Physical, chart, labs and discussed the procedure including the risks, benefits and alternatives for the proposed anesthesia with the patient or authorized representative who has indicated his/her understanding and acceptance.      Dental advisory given  Plan Discussed with: CRNA and Anesthesiologist  Anesthesia Plan Comments:         Anesthesia Quick Evaluation

## 2020-07-12 NOTE — Discharge Instructions (Signed)
AMBULATORY SURGERY  DISCHARGE INSTRUCTIONS   1) The drugs that you were given will stay in your system until tomorrow so for the next 24 hours you should not:  A) Drive an automobile B) Make any legal decisions C) Drink any alcoholic beverage   2) You may resume regular meals tomorrow.  Today it is better to start with liquids and gradually work up to solid foods.  You may eat anything you prefer, but it is better to start with liquids, then soup and crackers, and gradually work up to solid foods.   3) Please notify your doctor immediately if you have any unusual bleeding, trouble breathing, redness and pain at the surgery site, drainage, fever, or pain not relieved by medication.    4) Additional Instructions:        Please contact your physician with any problems or Same Day Surgery at [phone], Monday through Friday 6 am to 4 pm, or [place] at [facility] number at [phone].You have a ureteral stent in place.  This is a tube that extends from your kidney to your bladder.  This may cause urinary bleeding, burning with urination, and urinary frequency.  Please call our office or present to the ED if you develop fevers >101 or pain which is not able to be controlled with oral pain medications.  You may be given either Flomax and/ or ditropan to help with bladder spasms and stent pain in addition to pain medications.    [facility] [address], [address] [place], [place] [address] [phone]   Circumcision, Adult, Care After This sheet gives you information about how to care for yourself after your procedure. Your doctor may also give you more specific instructions. If you have problems or questions, contact your doctor. Follow these instructions at home: Cut care   Follow instructions from your doctor about how to take care of your cut from surgery (incision). Make sure you: ? Wash your hands with soap and water before you change  your bandage (dressing). If you cannot use soap and water, use hand sanitizer. ? Remove your dressing after 2 days (or earlier if you are unable to urinate.) ? Leave stitches (sutures), skin glue, or skin tape (adhesive) strips in place. They may need to stay in place for 2 weeks or longer. If tape strips get loose and curl up, you may trim the loose edges. Do not remove tape strips completely unless your doctor says it is okay  Check your cut area every day for signs of infection. Check for: ? More redness, swelling, or pain. ? More fluid or blood. ? Warmth. ? Pus or a bad smell. Bathing  Do not take baths, swim, or use a hot tub until your doctor says it is okay. Ask your doctor if you can take showers. You may only be allowed to take sponge baths for bathing.  Do not get your cut area wet during the 24 hours after the procedure, or as long as told by your doctor.  When you can shower, do not rub the cut area. Gently pat it dry. General instructions  Avoid heavy lifting, contact sports, biking, or swimming until your doctor says it is okay.  Do not have sex until your doctor says it is okay.  Take or apply over-the-counter and prescription medicines only as told by your doctor.  Keep all follow-up visits as told by your doctor. This is important. Contact a doctor if:  Medicine does not  help your pain.  You have more redness, swelling, or pain around your cut.  You have more fluid or blood coming from your cut.  Your cut feels warm to the touch.  You have pus or a bad smell coming from your cut.  You have a fever. Get help right away if:  You cannot pee (urinate).  It hurts to pee.  Your pain is not helped by medicines.  There is redness, swelling, and soreness that spreads up the shaft of your penis, your thighs, or your lower belly (abdomen).  You have bleeding that does not stop when you press on it. Summary  Follow instructions from your doctor about how to  take care of your cut from surgery (incision).  Check your cut area every day for signs of infection.  Do not have sex until your doctor says it is okay. This information is not intended to replace advice given to you by your health care provider. Make sure you discuss any questions you have with your health care provider. Document Revised: 07/20/2017 Document Reviewed: 08/15/2016 Elsevier Patient Education  2020 [employer]  AMBULATORY SURGERY  DISCHARGE INSTRUCTIONS   5) The drugs that you were given will stay in your system until tomorrow so for the next 24 hours you should not:  D) Drive an automobile E) Make any legal decisions F) Drink any alcoholic beverage   6) You may resume regular meals tomorrow.  Today it is better to start with liquids and gradually work up to solid foods.  You may eat anything you prefer, but it is better to start with liquids, then soup and crackers, and gradually work up to solid foods.   7) Please notify your doctor immediately if you have any unusual bleeding, trouble breathing, redness and pain at the surgery site, drainage, fever, or pain not relieved by medication.    8) Additional Instructions:        Please contact your physician with any problems or Same Day Surgery at [phone], Monday through Friday 6 am to 4 pm, or [place] at [facility] number at [phone].

## 2020-07-12 NOTE — Progress Notes (Signed)
   07/12/20 0740  Clinical Encounter Type  Visited With Patient and family together  Visit Type Initial  Referral From Chaplain  Consult/Referral To Chaplain  While rounding [facility] waiting area, chaplain briefly visited with Pt and daughter. Pt told chaplain he was having kidney stones crushed. He nor his daughter had any questions or concerns. Chaplain wished them well and told them to have Blessed Thanksgiving.

## 2020-07-12 NOTE — Interval H&P Note (Signed)
History and Physical Interval Note:  07/12/2020 8:04 AM  Cameron Randall  has presented today for surgery, with the diagnosis of phimosis, left kidney stone, microscopic hematuria.  The various methods of treatment have been discussed with the patient and family. After consideration of risks, benefits and other options for treatment, the patient has consented to  Procedure(s): CIRCUMCISION ADULT (N/A) CYSTOSCOPY/URETEROSCOPY/HOLMIUM LASER/STENT PLACEMENT (Left) CYSTOSCOPY WITH RETROGRADE PYELOGRAM (Bilateral) as a surgical intervention.  The patient's history has been reviewed, patient examined, no change in status, stable for surgery.  I have reviewed the patient's chart and labs.  Questions were answered to the patient's satisfaction.    RRR CTAB  Vanna Scotland

## 2020-07-12 NOTE — Anesthesia Procedure Notes (Signed)
Procedure Name: Intubation Date/Time: 07/12/2020 11:51 AM Performed by: Jaye Beagle, CRNA Pre-anesthesia Checklist: Patient identified, Emergency Drugs available, Suction available and Patient being monitored Patient Re-evaluated:Patient Re-evaluated prior to induction Oxygen Delivery Method: Circle system utilized Preoxygenation: Pre-oxygenation with 100% oxygen Induction Type: IV induction Ventilation: Mask ventilation without difficulty Laryngoscope Size: McGraph and 4 Grade View: Grade I Tube type: Oral Tube size: 7.5 mm Number of attempts: 1 Airway Equipment and Method: Stylet and Oral airway Placement Confirmation: ETT inserted through vocal cords under direct vision,  positive ETCO2 and breath sounds checked- equal and bilateral Secured at: 22 cm Tube secured with: Tape Dental Injury: Teeth and Oropharynx as per pre-operative assessment

## 2020-07-13 ENCOUNTER — Encounter: Payer: Self-pay | Admitting: Urology

## 2020-07-13 NOTE — Telephone Encounter (Signed)
The only cream I see on his med list is this:  nystatin-triamcinolone ointment (MYCOLOG)  Apply 1 application topically 2 (two) times daily  Is this what he talking about? I don't see Bactroban

## 2020-07-14 LAB — SURGICAL PATHOLOGY

## 2020-07-17 LAB — CALCULI, WITH PHOTOGRAPH (CLINICAL LAB)
Calcium Oxalate Dihydrate: 20 %
Calcium Oxalate Monohydrate: 80 %
Weight Calculi: 33 mg

## 2020-07-21 ENCOUNTER — Other Ambulatory Visit: Payer: Self-pay

## 2020-07-21 ENCOUNTER — Ambulatory Visit: Payer: BC Managed Care – PPO | Admitting: Urology

## 2020-07-21 ENCOUNTER — Encounter: Payer: Self-pay | Admitting: Urology

## 2020-07-21 VITALS — BP 128/82 | HR 77

## 2020-07-21 DIAGNOSIS — N200 Calculus of kidney: Secondary | ICD-10-CM | POA: Diagnosis not present

## 2020-07-21 MED ORDER — CIPROFLOXACIN HCL 500 MG PO TABS
500.0000 mg | ORAL_TABLET | Freq: Once | ORAL | Status: AC
  Administered 2020-07-21: 500 mg via ORAL

## 2020-07-21 NOTE — Progress Notes (Signed)
   07/21/20  CC:  Chief Complaint  Patient presents with  . Cysto Stent Removal    HPI: 64 year old male with a personal history of left nephrolithiasis and phimosis who returns today for cystoscopy, stent removal.  Blood pressure 128/82, pulse 77. NED. A&Ox3.   No respiratory distress   Abd soft, NT, ND Normal phallus with bilateral descended testicles, surgical incision healing well with a few retained sutures just below the coronal margin.  Cystoscopy/ Stent removal procedure  Patient identification was confirmed, informed consent was obtained, and patient was prepped using Betadine solution.  Lidocaine jelly was administered per urethral meatus.    Preoperative abx where received prior to procedure.    Procedure: - Flexible cystoscope introduced, without any difficulty.   - Thorough search of the bladder revealed:    normal urethral meatus  Stent seen emanating from Left ureteral orifice, grasped with stent graspers, and removed in entirety.     Post-Procedure: - Patient tolerated the procedure well   Assessment/ Plan:  1. Kidney stones Status post uncomplicated ureteroscopy  Stent removed today, discussed warning symptoms.  Antibiotics x1 dose given today.  Follow-up in 4 weeks with renal ultrasound prior.   - Urinalysis, Complete - ciprofloxacin (CIPRO) tablet 500 mg - Ultrasound renal complete; Future   Vanna Scotland, MD

## 2020-07-23 LAB — URINALYSIS, COMPLETE
Bilirubin, UA: NEGATIVE
Glucose, UA: NEGATIVE
Ketones, UA: NEGATIVE
Nitrite, UA: NEGATIVE
Specific Gravity, UA: >1.03 — ABNORMAL HIGH (ref 1.005–1.030)
Urobilinogen, Ur: 0.2 mg/dL (ref 0.2–1.0)
pH, UA: 5.5 (ref 5.0–7.5)

## 2020-07-23 LAB — MICROSCOPIC EXAMINATION: RBC, Urine: >30 /hpf — AB (ref 0–2)

## 2020-08-18 ENCOUNTER — Other Ambulatory Visit: Payer: Self-pay

## 2020-08-18 ENCOUNTER — Ambulatory Visit
Admission: RE | Admit: 2020-08-18 | Discharge: 2020-08-18 | Disposition: A | Discharge status: Home / Self Care | Payer: BC Managed Care – PPO | Source: Ambulatory Visit | Attending: Urology | Admitting: Urology

## 2020-08-18 DIAGNOSIS — N200 Calculus of kidney: Secondary | ICD-10-CM | POA: Insufficient documentation

## 2020-08-24 ENCOUNTER — Other Ambulatory Visit: Payer: Self-pay

## 2020-08-24 ENCOUNTER — Ambulatory Visit: Payer: BC Managed Care – PPO | Admitting: Urology

## 2020-08-24 DIAGNOSIS — R3129 Other microscopic hematuria: Secondary | ICD-10-CM | POA: Diagnosis not present

## 2020-08-24 DIAGNOSIS — N471 Phimosis: Secondary | ICD-10-CM | POA: Diagnosis not present

## 2020-08-24 DIAGNOSIS — N281 Cyst of kidney, acquired: Secondary | ICD-10-CM

## 2020-08-24 DIAGNOSIS — N200 Calculus of kidney: Secondary | ICD-10-CM

## 2020-08-24 NOTE — Progress Notes (Signed)
08/24/2020 12:12 PM   Cameron Randall 1956/05/28 [id number]  Referring provider: Lauro Regulus, MD [address] [facility] [place] [facility] [place] [facility] [place],  [place] [address]  Chief Complaint  Patient presents with  . Follow-up    HPI: 65 year old male who presents today for follow-up of multiple issues.  He has a personal history of phimosis.  At the time of ureteroscopic intervention, he elected to undergo a circumcision.  He reports today that is well-healed.  There is a similar tighter ring at the anastomotic site but otherwise no issues.  No penile pain.  Surgical pathology consistent with lichen sclerosis.  Personal history of recurrent stone disease status post interval passage of ureteral calculus x2 followed by ureteroscopic intervention for left nonobstructing stone 4 weeks ago.  Postoperatively commenced on well since his stent was removed.  Issues with flank pain or urinary symptoms.  He asked whether he needs to continue Flomax today.  This was prescribed perioperatively for stent pain.  Follow-up renal ultrasound shows no evidence of persistent hydronephrosis or stone disease.  Does have an incidental renal cyst.  Stone analysis consistent with calcium oxalate stone, 80% calcium oxalate monohydrate, 20% calcium oxalate dihydrate.     PMH: Past Medical History:  Diagnosis Date  . History of kidney stones   . Hyperlipidemia   . Hypertension   . Kidney stones     Surgical History: Past Surgical History:  Procedure Laterality Date  . BACK SURGERY     lower  . CIRCUMCISION N/A 07/12/2020   Procedure: CIRCUMCISION ADULT;  Surgeon: Vanna Scotland, MD;  Location: [facility] ORS;  Service: Urology;  Laterality: N/A;  . CYSTOSCOPY W/ RETROGRADES Bilateral 07/12/2020   Procedure: CYSTOSCOPY WITH RETROGRADE PYELOGRAM;  Surgeon: Vanna Scotland, MD;  Location: [facility] OR[facility];  Service: Urology;  Laterality: Bilateral;  . CYSTOSCOPY WITH STENT PLACEMENT Right 01/22/2017    Procedure: CYSTOSCOPY WITH STENT PLACEMENT;  Surgeon: Vanna Scotland, MD;  Location: [facility];  Service: Urology;  Laterality: Right;  . CYSTOSCOPY/URETEROSCOPY/HOLMIUM LASER/STENT PLACEMENT Left 07/12/2020   Procedure: CYSTOSCOPY/URETEROSCOPY/HOLMIUM LASER/STENT PLACEMENT;  Surgeon: Vanna Scotland, MD;  Location: [facility] OR[facility];  Service: Urology;  Laterality: Left;  . LITHOTRIPSY    . NECK SURGERY    . STONE EXTRACTION WITH BASKET Right 01/22/2017   Procedure: STONE EXTRACTION WITH BASKET;  Surgeon: Vanna Scotland, MD;  Location: [facility];  Service: Urology;  Laterality: Right;  . URETEROSCOPY WITH HOLMIUM LASER LITHOTRIPSY Right 01/22/2017   Procedure: URETEROSCOPY WITH HOLMIUM LASER LITHOTRIPSY;  Surgeon: Vanna Scotland, MD;  Location: [facility];  Service: Urology;  Laterality: Right;    Home Medications:  Allergies as of 08/24/2020   No Known Allergies     Medication List       Accurate as of August 24, 2020 12:12 PM. If you have any questions, ask your nurse or doctor.        STOP taking these medications   colchicine 0.6 MG tablet Stopped by: Vanna Scotland, MD   ibuprofen 800 MG tablet Commonly known as: ADVIL Stopped by: Vanna Scotland, MD   nystatin-triamcinolone ointment Commonly known as: MYCOLOG Stopped by: Vanna Scotland, MD   oxybutynin 5 MG tablet Commonly known as: DITROPAN Stopped by: Vanna Scotland, MD   oxyCODONE-acetaminophen 5-325 MG tablet Commonly known as: Percocet Stopped by: Vanna Scotland, MD     TAKE these medications   carvedilol 25 MG tablet Commonly known as: COREG Take 50 mg by mouth 2 (two) times daily.   cetirizine 10  MG tablet Commonly known as: ZYRTEC Take 10 mg by mouth every morning.   lisinopril-hydrochlorothiazide 20-25 MG tablet Commonly known as: ZESTORETIC Take 1 tablet by mouth every morning.   pravastatin 80 MG tablet Commonly known as: PRAVACHOL Take 80 mg by mouth every evening.   tamsulosin 0.4 MG Caps  capsule Commonly known as: FLOMAX Take 0.4 mg by mouth daily after supper.   TART CHERRY PO Take 2 capsules by mouth daily.       Allergies: No Known Allergies  Family History: No family history on file.  Social History:  reports that he has never smoked. His smokeless tobacco use includes snuff. He reports that he does not drink alcohol and does not use drugs.   Physical Exam: Constitutional:  Alert and oriented, No acute distress. HEENT: [place] AT, moist mucus membranes.  Trachea midline, no masses. Cardiovascular: No clubbing, cyanosis, or edema. Respiratory: Normal respiratory effort, no increased work of breathing. GU: Freshly circumcised phallus with excellent cosmesis, slightly tighter band at anastomotic site with complete resolution of the phimosis and penile bands. Skin: No rashes, bruises or suspicious lesions. Neurologic: Grossly intact, no focal deficits, moving all 4 extremities. Psychiatric: Normal mood and affect.  Pertinent Imaging: Ultrasound renal complete  Narrative CLINICAL DATA:  Nephrolithiasis with recent left ureteroscopy  EXAM: RENAL / URINARY TRACT ULTRASOUND COMPLETE  COMPARISON:  CT abdomen and pelvis April 23, 2020  FINDINGS: Right Kidney:  Renal measurements: 12.5 x 6.2 x 5.3 cm = volume: 214.5 mL. Echogenicity and renal cortical thickness are within normal limits. Fetal lobulations present, an anatomic variant. No perinephric fluid or hydronephrosis visualized. There is a cyst in the mid right kidney measuring 1.6 x 1.7 x 1.7 cm. No sonographically demonstrable calculus or ureterectasis.  Left Kidney:  Renal measurements: 14.2 x 6.0 x 4.8 cm = volume: 249.3 mL. Echogenicity and renal cortical thickness are within normal limits. Fetal lobulations present, an anatomic variant. No mass, perinephric fluid, or hydronephrosis visualized. No sonographically demonstrable calculus or ureterectasis.  Bladder:  Appears normal for degree  of bladder distention. Flow from each distal ureter seen in the bladder.  Other:  None.  IMPRESSION: Cyst mid right kidney measuring 1.6 x 1.7 x 1.7 cm. Study otherwise unremarkable.   Electronically Signed By: Bretta Bang III M.D. On: 08/19/2020 08:33  Renal ultrasound was personally reviewed today.  Agree with radiology interpretation.  Assessment & Plan:    1. Kidney stones Status post uncomplicated ureteroscopy  Stone analysis reviewed  Discussed the role of 24-hour urine, declined at this time but may consider for the future  We discussed general stone prevention techniques including drinking plenty water with goal of producing 2.5 L urine daily, increased citric acid intake, avoidance of high oxalate containing foods, and decreased salt intake.  Information about dietary recommendations given today.  - Abdomen 1 view (KUB); Future - Urinalysis, Complete; Future  2. Phimosis Status post circumcision, pathology consistent with lichen sclerosis  Healing well, anticipate continued cosmetic improvement with time and loosening at the anastomotic line  3. Microscopic hematuria Status post complete evaluation, likely secondary to #1  4. Renal cyst Incidental on ultrasound, benign features  No further evaluation or surveillance needed   Follow-up in 1 year with KUB/urinalysis  Vanna Scotland, MD  [facility] [address], [address] [place], [place] [address] [phone]

## 2020-11-02 ENCOUNTER — Ambulatory Visit: Payer: Self-pay | Admitting: Urology

## 2021-01-06 IMAGING — CT CT RENAL STONE PROTOCOL
2 of 4 series · 16 of 46 positions shown, 18 images · non-contrast
Comparison: CT 01/16/2017

CLINICAL DATA: Left flank pain

EXAM:
CT ABDOMEN AND PELVIS WITHOUT CONTRAST
TECHNIQUE: Multidetector CT imaging of the abdomen and pelvis was performed
following the standard protocol without IV contrast.

[Series 2: stone full standard · axial · 0.98mm/px · z∈[-478,+8]mm · 13 of 107 slices shown, 15 images]
[im 5/107  soft-tissue]
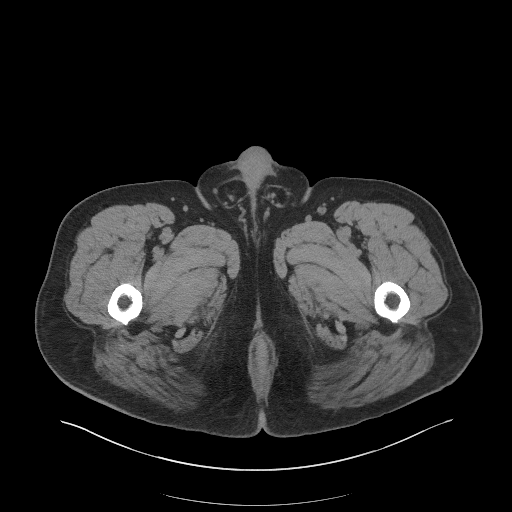
[im 5/107  bone]
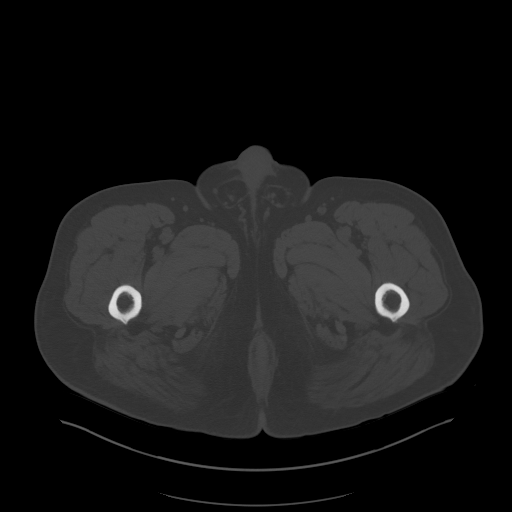
[im 14/107  soft-tissue]
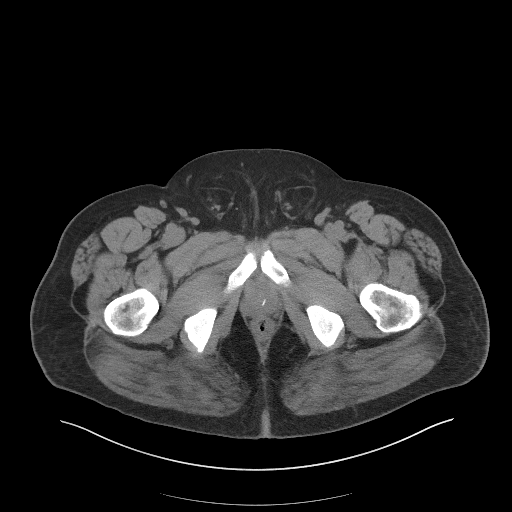
[im 23/107  soft-tissue]
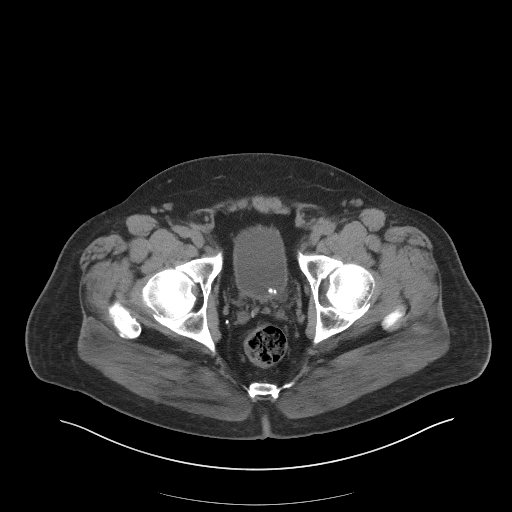
[im 31/107  soft-tissue]
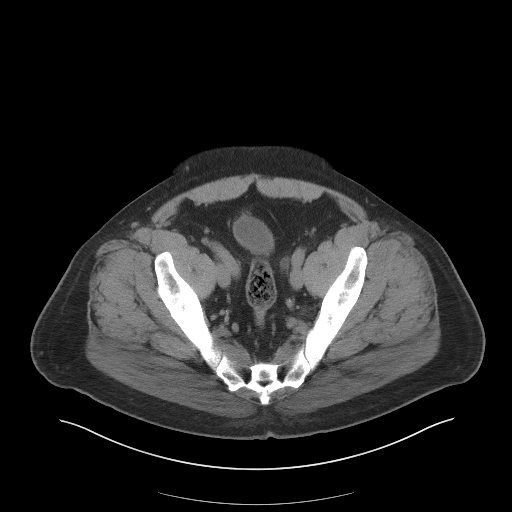
[im 36/107  soft-tissue]
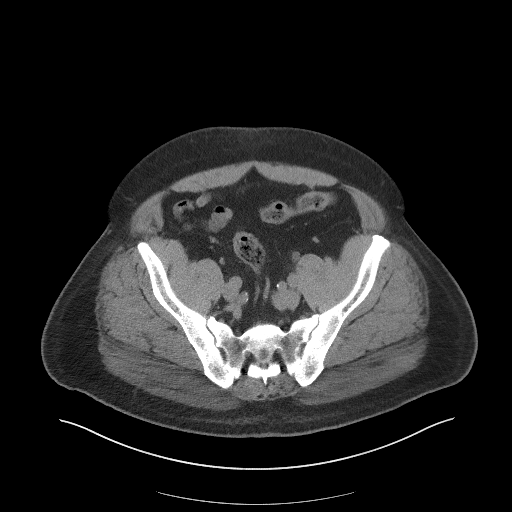
[im 45/107  soft-tissue]
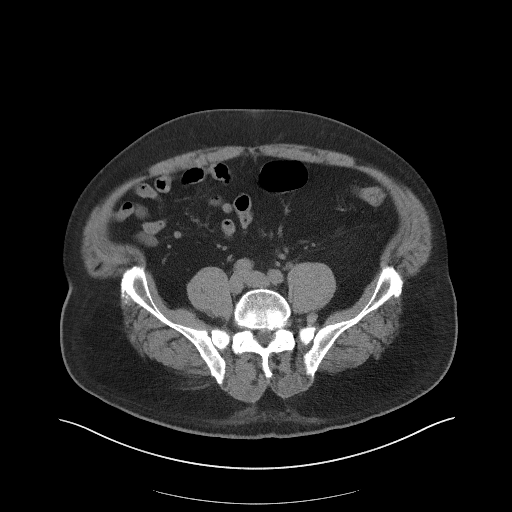
[im 54/107  soft-tissue]
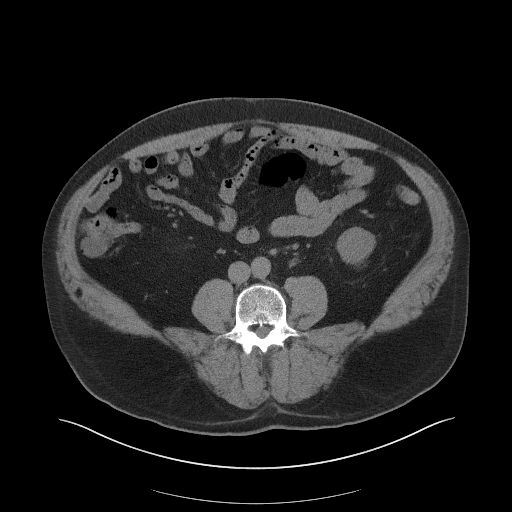
[im 62/107  soft-tissue]
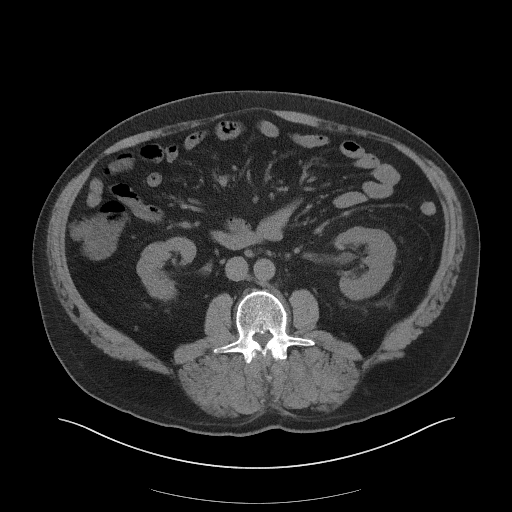
[im 71/107  soft-tissue]
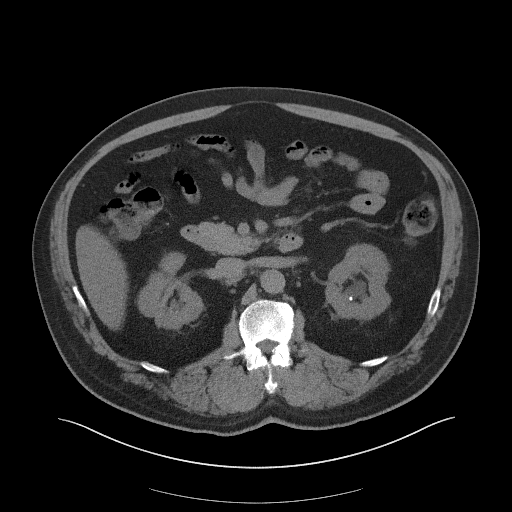
[im 71/107  bone]
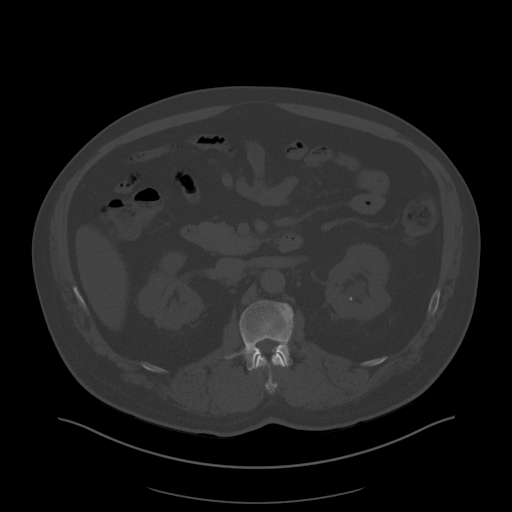
[im 76/107  soft-tissue]
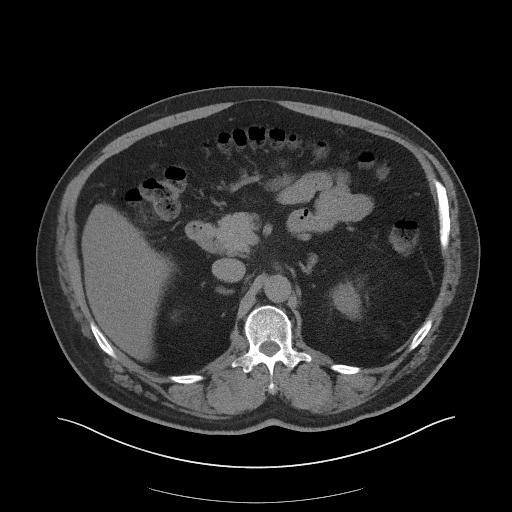
[im 84/107  soft-tissue]
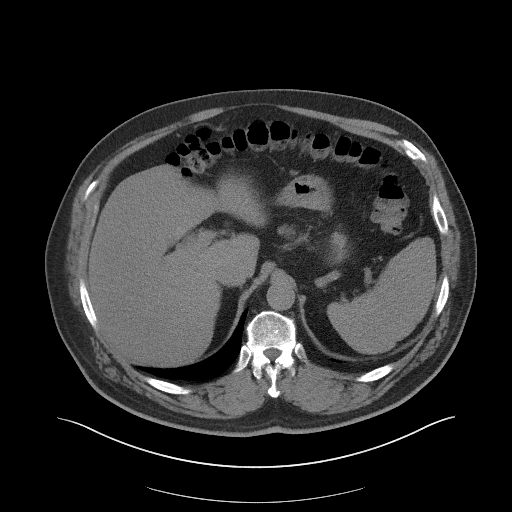
[im 93/107  soft-tissue]
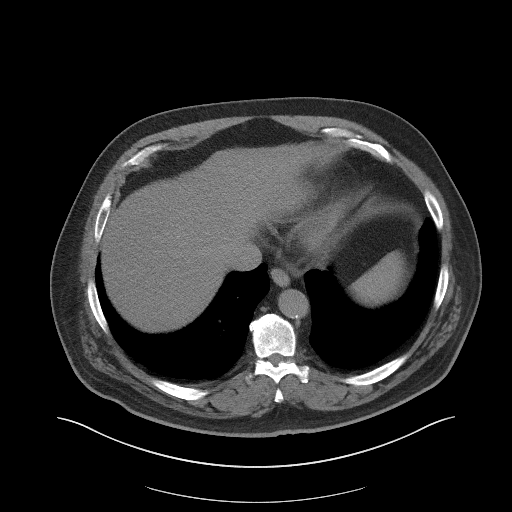
[im 102/107  soft-tissue]
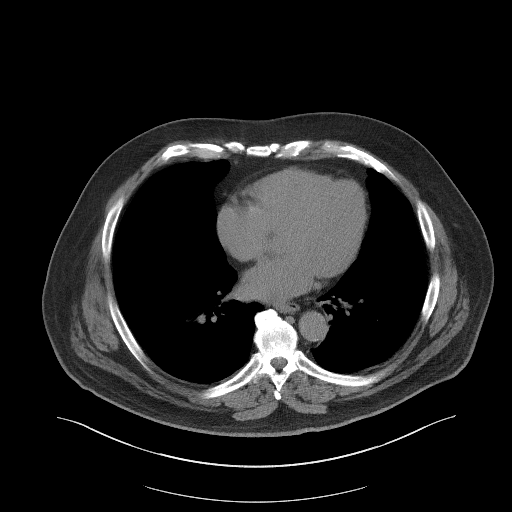

[Series 5: coronal · coronal · 0.90mm/px · 3 of 164 slices shown]
[im 55/164  soft-tissue]
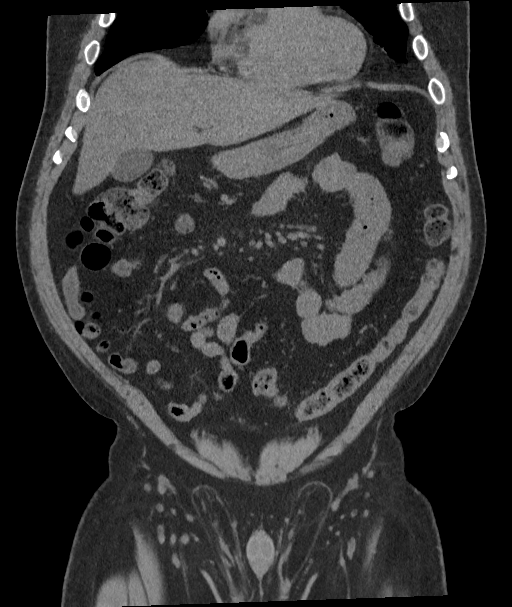
[im 73/164  soft-tissue]
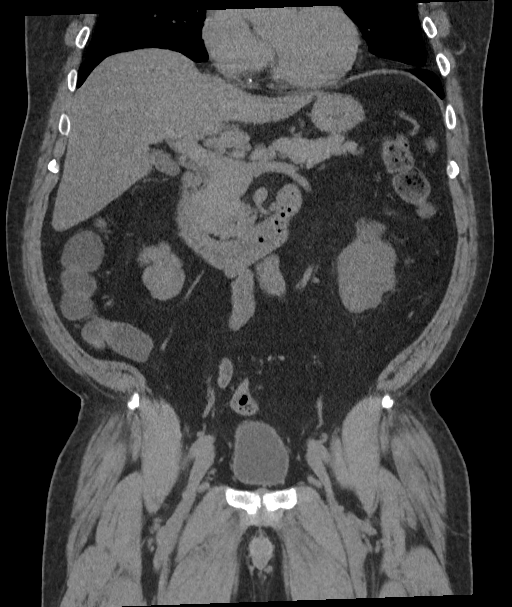
[im 91/164  soft-tissue]
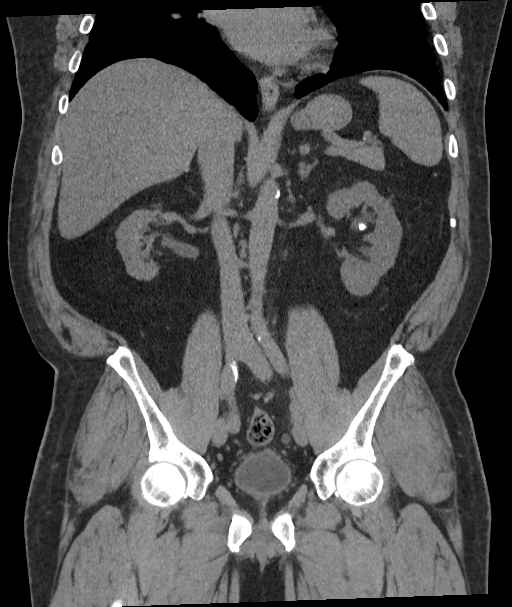

[16 of 46 positions shown; findings below may reference images not displayed]

FINDINGS: Lower chest: Lung bases demonstrate stable subpleural nodularity at
the right middle lobe. No acute consolidation or effusion.

Hepatobiliary: No focal liver abnormality is seen. No gallstones,
gallbladder wall thickening, or biliary dilatation.

Pancreas: Unremarkable. No pancreatic ductal dilatation or
surrounding inflammatory changes.

Spleen: Normal in size without focal abnormality.

Adrenals/Urinary Tract: Adrenal glands are normal. Lobulated renal
contour bilaterally. Areas of cortical scarring in the right kidney.
Hypodense renal cortical lesions bilaterally, cannot be further
evaluated without contrast. Multiple intrarenal stones on the left,
including 17 mm stone in the midpole. Mild left hydronephrosis and
hydroureter. Two adjacent stones within the left posterior bladder
measuring 5 and 3 mm, likely reflecting recently passed kidney
stones. The bladder is otherwise unremarkable.

Stomach/Bowel: Stomach is within normal limits. Appendix appears
normal. No evidence of bowel wall thickening, distention, or
inflammatory changes.

Vascular/Lymphatic: Mild aortic atherosclerosis. No aneurysm. No
suspicious adenopathy.

Reproductive: Prostate calcification.  No mass.

Other: Negative for free air or free fluid. Fat containing inguinal
hernias bilaterally. Small fat containing umbilical hernia.

Musculoskeletal: No acute or significant osseous findings.
IMPRESSION: 1. Mild left hydronephrosis and hydroureter. Two adjacent stones
within the left posterior bladder measuring 5 and 3 mm, likely
reflecting recently passed kidney stones.
2. Multiple intrarenal stones on the left.

Aortic Atherosclerosis (PGEHD-NP4.4).

## 2021-05-03 IMAGING — US US RENAL
1 series · 14 of 25 positions shown · non-contrast
Comparison: CT abdomen and pelvis April 23, 2020

CLINICAL DATA: Nephrolithiasis with recent left ureteroscopy

EXAM:
RENAL / URINARY TRACT ULTRASOUND COMPLETE

[Series 1: us renal · 14 of 41 slices shown]
[im 1/41]
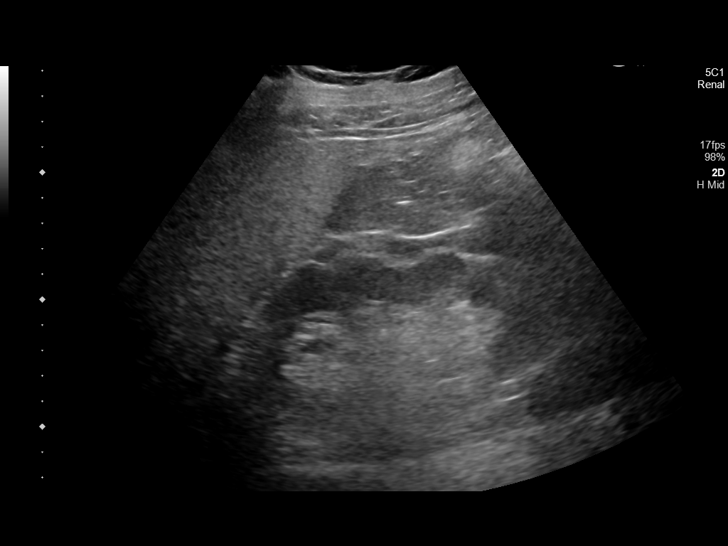
[im 4/41]
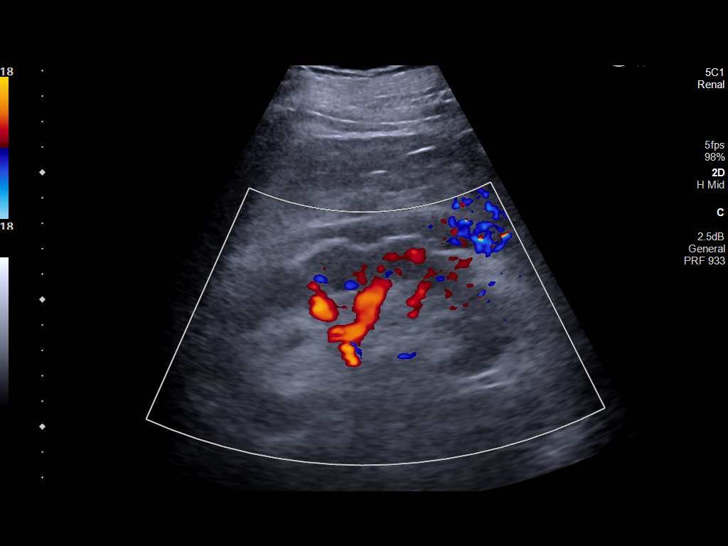
[im 7/41]
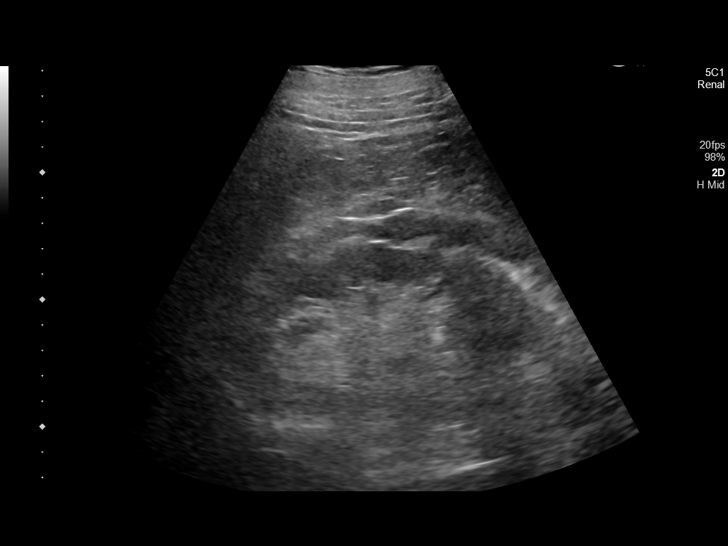
[im 11/41]
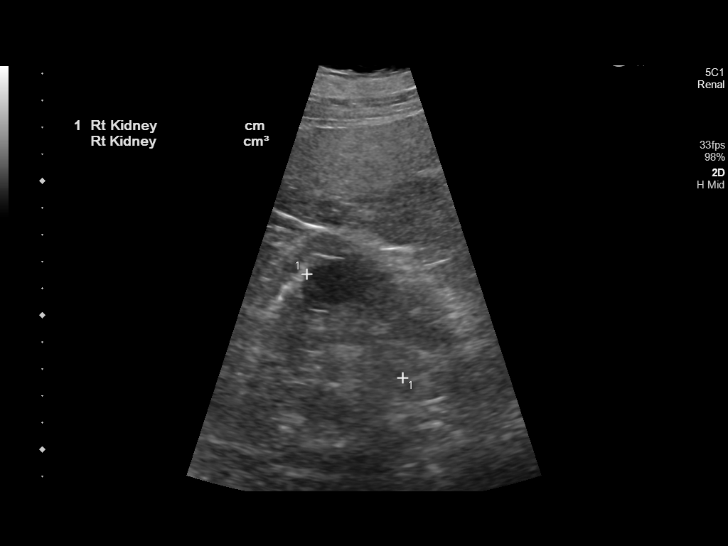
[im 14/41]
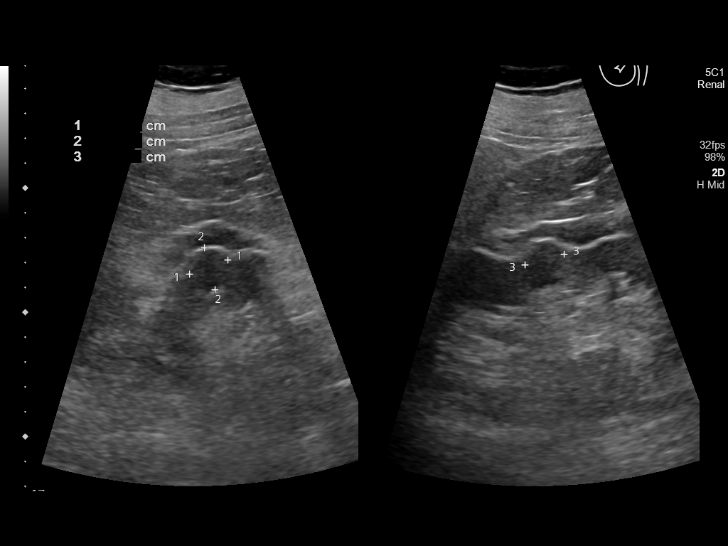
[im 16/41]
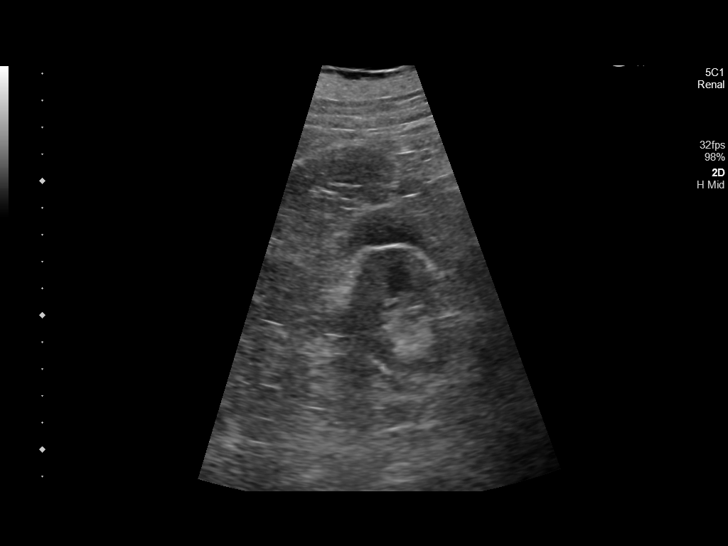
[im 19/41]
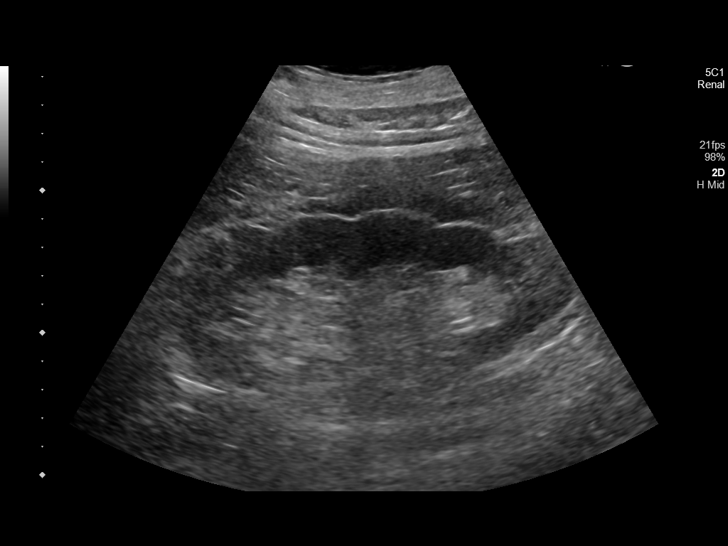
[im 22/41]
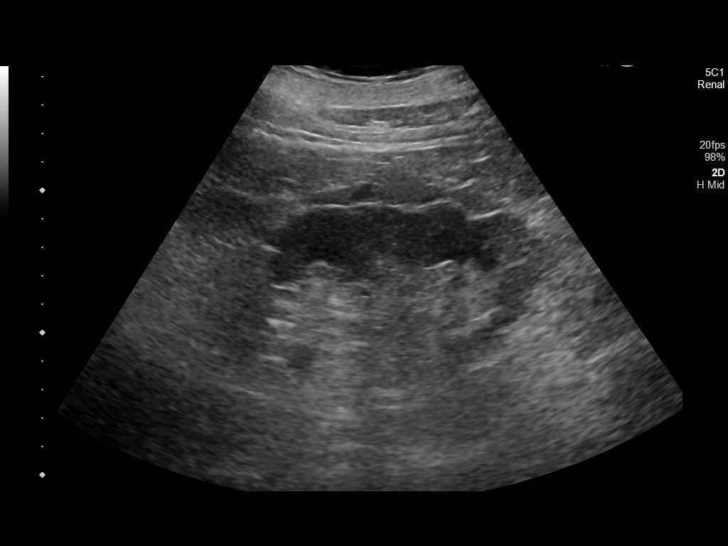
[im 26/41]
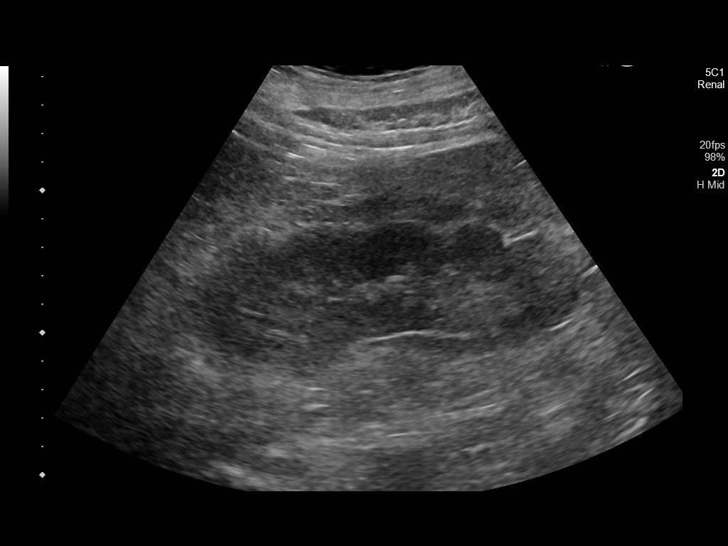
[im 27/41]
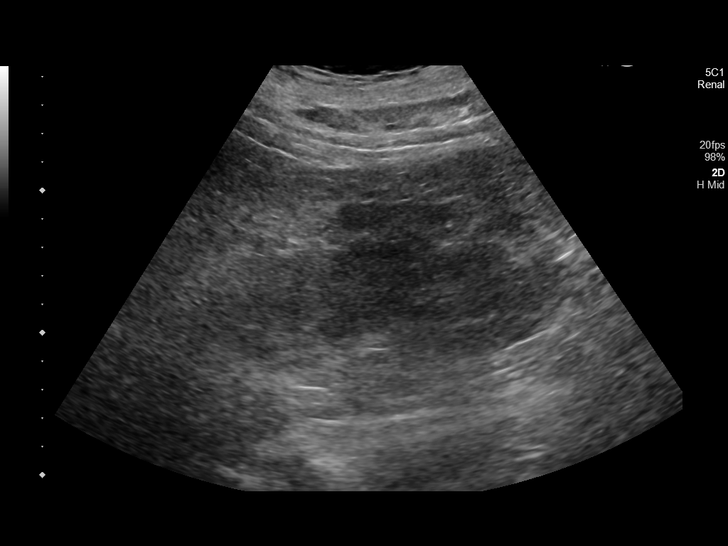
[im 31/41]
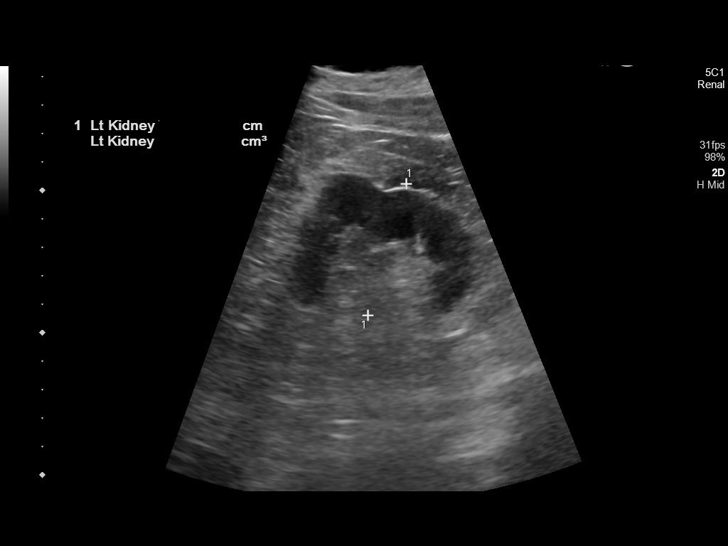
[im 34/41]
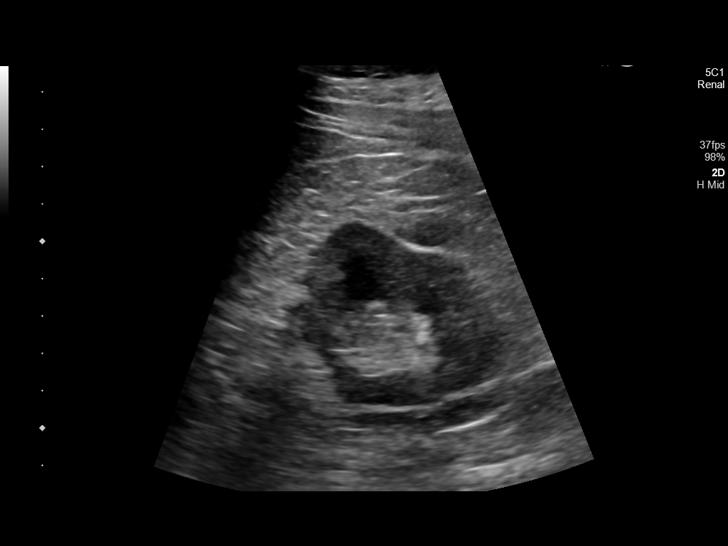
[im 37/41]
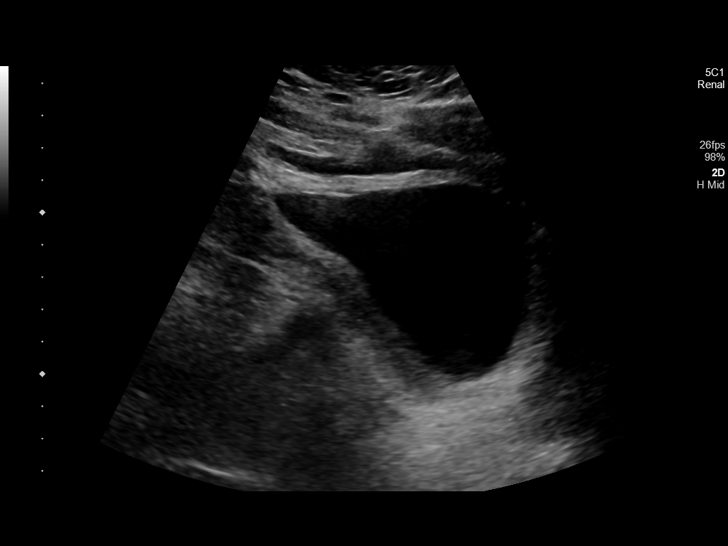
[im 41/41]
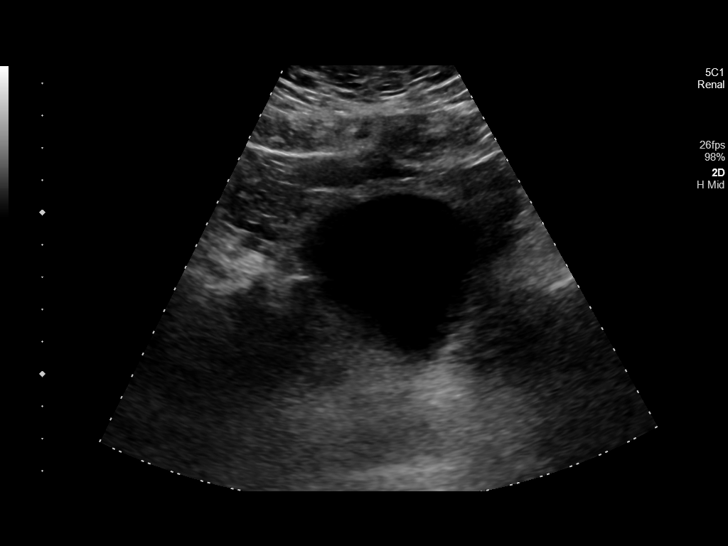

[14 of 25 positions shown; findings below may reference images not displayed]

FINDINGS: Right Kidney:

Renal measurements: 12.5 x 6.2 x 5.3 cm = volume: 214.5 mL.
Echogenicity and renal cortical thickness are within normal limits.
Fetal lobulations present, an anatomic variant. No perinephric fluid
or hydronephrosis visualized. There is a cyst in the mid right
kidney measuring 1.6 x 1.7 x 1.7 cm. No sonographically demonstrable
calculus or ureterectasis.

Left Kidney:

Renal measurements: 14.2 x 6.0 x 4.8 cm = volume: 249.3 mL.
Echogenicity and renal cortical thickness are within normal limits.
Fetal lobulations present, an anatomic variant. No mass, perinephric
fluid, or hydronephrosis visualized. No sonographically demonstrable
calculus or ureterectasis.

Bladder:

Appears normal for degree of bladder distention. Flow from each
distal ureter seen in the bladder.

Other:

None.
IMPRESSION: Cyst mid right kidney measuring 1.6 x 1.7 x 1.7 cm. Study otherwise
unremarkable.

## 2021-08-23 ENCOUNTER — Ambulatory Visit
Admission: RE | Admit: 2021-08-23 | Discharge: 2021-08-23 | Disposition: A | Discharge status: Home / Self Care | Payer: BC Managed Care – PPO | Source: Ambulatory Visit | Attending: Urology | Admitting: Urology

## 2021-08-23 DIAGNOSIS — N200 Calculus of kidney: Secondary | ICD-10-CM | POA: Diagnosis not present

## 2021-08-23 NOTE — Progress Notes (Signed)
08/24/21 8:37 AM   Cameron Randall [birth year] [id number]  Referring provider:  Kirk Ruths, MD [place] [facility] [place],  [place] [address] Chief Complaint  Patient presents with   Nephrolithiasis     HPI: Cameron Randall is a 66 y.o.male with a personal history of phimosis, renal cyst, microscopic hematuria, and recurrent stone disease, who presents today for 1 year follow-up with KUB and UA.   At the time of ureteroscopic intervention, he elected to undergo a circumcision.  He is s/p interval passage of ureteral calculus x2 followed by ureteroscopic intervention for a left nonobstructive stone on 07/12/2020.   Stone analysis at the time was consistent with calcium oxalate stone, 80% calcium oxalate monohydrate, 20% calcium oxalate dihydrate.  His most recent PSA was on 02/11/2021 and was 0.94.   KUB on 08/23/2021 revealed no definite calcific densities are seen in the region of the kidneys and along the courses of the ureters.  He is doing well today on a urinary standpoint. He denies any stone episodes since last visit. He has increased water intake.   PMH: Past Medical History:  Diagnosis Date   History of kidney stones    Hyperlipidemia    Hypertension    Kidney stones     Surgical History: Past Surgical History:  Procedure Laterality Date   BACK SURGERY     lower   CIRCUMCISION N/A 07/12/2020   Procedure: CIRCUMCISION ADULT;  Surgeon: Hollice Espy, MD;  Location: [facility];  Service: Urology;  Laterality: N/A;   CYSTOSCOPY W/ RETROGRADES Bilateral 07/12/2020   Procedure: CYSTOSCOPY WITH RETROGRADE PYELOGRAM;  Surgeon: Hollice Espy, MD;  Location: [facility];  Service: Urology;  Laterality: Bilateral;   CYSTOSCOPY WITH STENT PLACEMENT Right 01/22/2017   Procedure: CYSTOSCOPY WITH STENT PLACEMENT;  Surgeon: Hollice Espy, MD;  Location: [facility];  Service: Urology;  Laterality: Right;   CYSTOSCOPY/URETEROSCOPY/HOLMIUM LASER/STENT  PLACEMENT Left 07/12/2020   Procedure: CYSTOSCOPY/URETEROSCOPY/HOLMIUM LASER/STENT PLACEMENT;  Surgeon: Hollice Espy, MD;  Location: [facility];  Service: Urology;  Laterality: Left;   LITHOTRIPSY     NECK SURGERY     STONE EXTRACTION WITH BASKET Right 01/22/2017   Procedure: STONE EXTRACTION WITH BASKET;  Surgeon: Hollice Espy, MD;  Location: [facility];  Service: Urology;  Laterality: Right;   URETEROSCOPY WITH HOLMIUM LASER LITHOTRIPSY Right 01/22/2017   Procedure: URETEROSCOPY WITH HOLMIUM LASER LITHOTRIPSY;  Surgeon: Hollice Espy, MD;  Location: [facility];  Service: Urology;  Laterality: Right;    Home Medications:  Allergies as of 08/24/2021   No Known Allergies      Medication List        Accurate as of August 24, 2021  8:37 AM. If you have any questions, ask your nurse or doctor.          STOP taking these medications    tamsulosin 0.4 MG Caps capsule Commonly known as: FLOMAX Stopped by: Hollice Espy, MD       TAKE these medications    carvedilol 25 MG tablet Commonly known as: COREG Take 50 mg by mouth 2 (two) times daily.   cetirizine 10 MG tablet Commonly known as: ZYRTEC Take 10 mg by mouth every morning.   lisinopril-hydrochlorothiazide 20-25 MG tablet Commonly known as: ZESTORETIC Take 1 tablet by mouth every morning.   magnesium oxide 400 MG tablet Commonly known as: MAG-OX Take by mouth.   pravastatin 80 MG tablet Commonly known as: PRAVACHOL Take 80 mg by mouth every  evening.   TART CHERRY PO Take 2 capsules by mouth daily.        Allergies: No Known Allergies  Family History: History reviewed. No pertinent family history.  Social History:  reports that he has never smoked. His smokeless tobacco use includes snuff. He reports that he does not drink alcohol and does not use drugs.   Physical Exam: BP (!) 156/79    Pulse 74    Ht 5\' 8"  (1.727 m)    Wt 265 lb (120.2 kg)    BMI 40.29 kg/m   Constitutional:  Alert and oriented,  No acute distress. HEENT: [place] AT, moist mucus membranes.  Trachea midline, no masses. Cardiovascular: No clubbing, cyanosis, or edema. Respiratory: Normal respiratory effort, no increased work of breathing. Skin: No rashes, bruises or suspicious lesions. Neurologic: Grossly intact, no focal deficits, moving all 4 extremities. Psychiatric: Normal mood and affect.  Laboratory Data:  Lab Results  Component Value Date   CREATININE 1.49 (H) 01/15/2017   Urinalysis Unremarkable   Pertinent Imaging: CLINICAL DATA:  Renal stones   EXAM: ABDOMEN - 1 VIEW   COMPARISON:  Previous studies including the CT done on Apr 27, 2020   FINDINGS: Bowel gas pattern is nonspecific. No abnormal masses or calcifications are seen. Left renal stones seen in the previous CT done on 2020-04-27 could not be distinctly visualized in the current study. Bowel contents are partly obscuring the kidneys. Degenerative changes are noted in the lumbar spine.   IMPRESSION: Nonspecific bowel gas pattern. No definite calcific densities are seen in the region of the kidneys and along the courses of the ureters.     Electronically Signed   By: Elmer Picker M.D.   On: 08/23/2021 08:54  KUB images were personally reviewed today.  Agree with radiologic interpretation.  No calculi noted.  Assessment & Plan:    History of kidney stones  - s/p ureteroscopy  - Recent imaging in the form of KUB was reviewed and unremarkable  - We discussed general stone prevention techniques including drinking plenty water with goal of producing 2.5 L urine daily, increased citric acid intake, avoidance of high oxalate containing foods, and decreased salt intake.  Information about dietary recommendations given today.  - He is to follow-up as needed if he feels he is experiencing a stone   2. Phimosis  - s/p circumcision  - He healed well and is pleased with cosmetic.   3. Microscopic hematuria  - Resolved  - Urinalysis  today shows no blood   Follow-up as needed   I,Kailey Littlejohn,acting as a scribe for Hollice Espy, MD.,have documented all relevant documentation on the behalf of Hollice Espy, MD,as directed by  Hollice Espy, MD while in the presence of Hollice Espy, MD.  I have reviewed the above documentation for accuracy and completeness, and I agree with the above.   Hollice Espy, MD   [facility] [address], [place], [place] [address] [phone]

## 2021-08-24 ENCOUNTER — Ambulatory Visit: Payer: BC Managed Care – PPO | Admitting: Urology

## 2021-08-24 ENCOUNTER — Other Ambulatory Visit: Payer: Self-pay

## 2021-08-24 ENCOUNTER — Encounter: Payer: Self-pay | Admitting: Urology

## 2021-08-24 VITALS — BP 156/79 | HR 74 | Ht 68.0 in | Wt 265.0 lb

## 2021-08-24 DIAGNOSIS — N200 Calculus of kidney: Secondary | ICD-10-CM

## 2021-08-24 LAB — URINALYSIS, COMPLETE
Bilirubin, UA: NEGATIVE
Glucose, UA: NEGATIVE
Ketones, UA: NEGATIVE
Leukocytes,UA: NEGATIVE
Nitrite, UA: NEGATIVE
Protein,UA: NEGATIVE
RBC, UA: NEGATIVE
Specific Gravity, UA: 1.02 (ref 1.005–1.030)
Urobilinogen, Ur: 0.2 mg/dL (ref 0.2–1.0)
pH, UA: 6.5 (ref 5.0–7.5)

## 2021-08-24 LAB — MICROSCOPIC EXAMINATION: Bacteria, UA: NONE SEEN

## 2023-12-31 ENCOUNTER — Encounter: Payer: Self-pay | Admitting: Internal Medicine

## 2024-01-01 ENCOUNTER — Ambulatory Visit: Admitting: General Practice

## 2024-01-01 ENCOUNTER — Encounter: Payer: Self-pay | Admitting: Internal Medicine

## 2024-01-01 ENCOUNTER — Ambulatory Visit
Admission: RE | Admit: 2024-01-01 | Discharge: 2024-01-01 | Disposition: A | Discharge status: Home / Self Care | Attending: Internal Medicine | Admitting: Internal Medicine

## 2024-01-01 ENCOUNTER — Encounter
Admission: RE | Disposition: A | Discharge status: Home / Self Care | Payer: Self-pay | Source: Home / Self Care | Attending: Internal Medicine

## 2024-01-01 ENCOUNTER — Other Ambulatory Visit: Payer: Self-pay

## 2024-01-01 DIAGNOSIS — Z1211 Encounter for screening for malignant neoplasm of colon: Secondary | ICD-10-CM | POA: Diagnosis present

## 2024-01-01 DIAGNOSIS — I10 Essential (primary) hypertension: Secondary | ICD-10-CM | POA: Diagnosis not present

## 2024-01-01 DIAGNOSIS — D123 Benign neoplasm of transverse colon: Secondary | ICD-10-CM | POA: Insufficient documentation

## 2024-01-01 DIAGNOSIS — K640 First degree hemorrhoids: Secondary | ICD-10-CM | POA: Insufficient documentation

## 2024-01-01 HISTORY — PX: POLYPECTOMY: SHX149

## 2024-01-01 HISTORY — PX: COLONOSCOPY: SHX5424

## 2024-01-01 SURGERY — COLONOSCOPY
Anesthesia: General

## 2024-01-01 MED ORDER — PROPOFOL 10 MG/ML IV BOLUS
INTRAVENOUS | Status: DC | PRN
  Administered 2024-01-01 (×2): 50 mg via INTRAVENOUS

## 2024-01-01 MED ORDER — DEXMEDETOMIDINE HCL IN NACL 80 MCG/20ML IV SOLN
INTRAVENOUS | Status: DC | PRN
  Administered 2024-01-01: 20 ug via INTRAVENOUS

## 2024-01-01 MED ORDER — SODIUM CHLORIDE 0.9 % IV SOLN: INTRAVENOUS | Status: DC

## 2024-01-01 MED ORDER — PROPOFOL 500 MG/50ML IV EMUL
INTRAVENOUS | Status: DC | PRN
  Administered 2024-01-01: 75 ug/kg/min via INTRAVENOUS

## 2024-01-01 MED ORDER — LIDOCAINE HCL (CARDIAC) PF 100 MG/5ML IV SOSY
PREFILLED_SYRINGE | INTRAVENOUS | Status: DC | PRN
  Administered 2024-01-01: 80 mg via INTRAVENOUS

## 2024-01-01 MED ORDER — LIDOCAINE HCL (PF) 2 % IJ SOLN
INTRAMUSCULAR | Status: AC
  Filled 2024-01-01: qty 5

## 2024-01-01 NOTE — Transfer of Care (Signed)
 Immediate Anesthesia Transfer of Care Note  Patient: Cameron Randall  Procedure(s) Performed: COLONOSCOPY POLYPECTOMY, INTESTINE  Patient Location: PACU  Anesthesia Type:General  Level of Consciousness: sedated  Airway & Oxygen Therapy: Patient Spontanous Breathing  Post-op Assessment: Report given to RN and Post -op Vital signs reviewed and stable  Post vital signs: Reviewed and stable  Last Vitals:  Vitals Value Taken Time  BP    Temp    Pulse    Resp    SpO2      Last Pain:  Vitals:   01/01/24 0814  TempSrc: Temporal  PainSc: 0-No pain         Complications: No notable events documented.

## 2024-01-01 NOTE — OR Nursing (Signed)
 Pts brother-in-law with him.  Will have him wait in lobby until pt ready. Ok with pt.

## 2024-01-01 NOTE — Op Note (Signed)
 [facility] Patient Name: Cameron Randall Procedure Date: 01/01/2024 8:56 AM MRN: [id number] Account #: [id number] Date of Birth: 02-05-1956 Admit Type: Outpatient Age: 68 Room: [facility] ENDO ROOM 1 Gender: Male Note Status: Finalized Instrument Name: Charlyn Cooley [id number] Procedure:             Colonoscopy Indications:           High risk colon cancer surveillance: Personal history                         of non-advanced adenoma Providers:             Cheyenne Bordeaux K. Sanah Kraska MD, MD Medicines:             Propofol  per Anesthesia Complications:         No immediate complications. Estimated blood loss:                         Minimal. Procedure:             Pre-Anesthesia Assessment:                        - The risks and benefits of the procedure and the                         sedation options and risks were discussed with the                         patient. All questions were answered and informed                         consent was obtained.                        - Patient identification and proposed procedure were                         verified prior to the procedure by the nurse. The                         procedure was verified in the procedure room.                        - ASA Grade Assessment: III - A patient with severe                         systemic disease.                        - After reviewing the risks and benefits, the patient                         was deemed in satisfactory condition to undergo the                         procedure.                        After obtaining informed consent, the colonoscope was  passed under direct vision. Throughout the procedure,                         the patient's blood pressure, pulse, and oxygen                         saturations were monitored continuously. The                         Colonoscope was introduced through the anus and                         advanced to the  the cecum, identified by appendiceal                         orifice and ileocecal valve. The colonoscopy was                         performed without difficulty. The patient tolerated                         the procedure well. The quality of the bowel                         preparation was good. The ileocecal valve, appendiceal                         orifice, and rectum were photographed. Findings:      The perianal and digital rectal examinations were normal. Pertinent       negatives include normal sphincter tone and no palpable rectal lesions.      Non-bleeding internal hemorrhoids were found during retroflexion. The       hemorrhoids were Grade I (internal hemorrhoids that do not prolapse).      A 6 mm polyp was found in the transverse colon. The polyp was sessile.       The polyp was removed with a cold snare. Resection and retrieval were       complete. Estimated blood loss was minimal.      The exam was otherwise without abnormality. Impression:            - Non-bleeding internal hemorrhoids.                        - One 6 mm polyp in the transverse colon, removed with                         a cold snare. Resected and retrieved.                        - The examination was otherwise normal. Recommendation:        - Patient has a contact number available for                         emergencies. The signs and symptoms of potential                         delayed complications were discussed with the patient.  Return to normal activities tomorrow. Written                         discharge instructions were provided to the patient.                        - Resume previous diet.                        - Continue present medications.                        - Repeat colonoscopy is recommended for surveillance.                         The colonoscopy date will be determined after                         pathology results from today's exam become available                          for review.                        - Return to GI office PRN.                        - The findings and recommendations were discussed with                         the patient. Procedure Code(s):     --- Professional ---                        [phone], Colonoscopy, flexible; with removal of                         tumor(s), polyp(s), or other lesion(s) by snare                         technique Diagnosis Code(s):     --- Professional ---                        K64.0, First degree hemorrhoids                        D12.3, Benign neoplasm of transverse colon (hepatic                         flexure or splenic flexure)                        Z86.010, Personal history of colonic polyps CPT copyright 2022 American [employer]. All rights reserved. The codes documented in this report are preliminary and upon coder review may  be revised to meet current compliance requirements. Cassie Click MD, MD 01/01/2024 9:31:36 AM This report has been signed electronically. Number of Addenda: 0 Note Initiated On: 01/01/2024 8:56 AM Scope Withdrawal Time: 0 hours 8 minutes 2 seconds  Total Procedure Duration: 0 hours 11 minutes 52 seconds  Estimated Blood Loss:  Estimated blood loss was minimal.  [facility]

## 2024-01-01 NOTE — H&P (Signed)
  Outpatient short stay form Pre-procedure 01/01/2024 8:56 AM Cameron Randall K. Corky Diener, M.D.  Primary Physician: Overton Blotter, M.D.  Reason for visit:  06/19/2018 Physician: Dr. Zackary Heron @ [facility] Polyps Removed: Adenomatous Polyps   History of present illness: Patient is a 68 year old male history of hypertension presenting for surveillance colonoscopy for personal history of adenomatous polyps on June 02, 2018.Patient denies change in bowel habits, rectal bleeding, weight loss or abdominal pain.      Current Facility-Administered Medications:    0.9 %  sodium chloride  infusion, , Intravenous, Continuous, Sanford, Susi Goslin K, MD, Last Rate: 20 mL/hr at 01/01/24 0846, Continued from Pre-op at 01/01/24 0846  Medications Prior to Admission  Medication Sig Dispense Refill Last Dose/Taking   carvedilol  (COREG ) 25 MG tablet Take 50 mg by mouth 2 (two) times daily.   12/31/2023   cetirizine (ZYRTEC) 10 MG tablet Take 10 mg by mouth every morning.    12/31/2023   lisinopril-hydrochlorothiazide (PRINZIDE,ZESTORETIC) 20-25 MG tablet Take 1 tablet by mouth every morning.    12/31/2023   pravastatin (PRAVACHOL) 80 MG tablet Take 80 mg by mouth every evening.   12/31/2023   TART CHERRY PO Take 2 capsules by mouth daily.    Past Week   magnesium oxide (MAG-OX) 400 MG tablet Take by mouth.        No Known Allergies   Past Medical History:  Diagnosis Date   History of kidney stones    Hyperlipidemia    Hypertension    Kidney stones     Review of systems:  Otherwise negative.    Physical Exam  Gen: Alert, oriented. Appears stated age.  HEENT: [place]/AT. PERRLA. Lungs: CTA, no wheezes. CV: RR nl S1, S2. Abd: soft, benign, no masses. BS+ Ext: No edema. Pulses 2+    Planned procedures: Proceed with colonoscopy. The patient understands the nature of the planned procedure, indications, risks, alternatives and potential complications including but not limited to bleeding, infection,  perforation, damage to internal organs and possible oversedation/side effects from anesthesia. The patient agrees and gives consent to proceed.  Please refer to procedure notes for findings, recommendations and patient disposition/instructions.     Orvan Papadakis K. Corky Diener, M.D. Gastroenterology 01/01/2024  8:56 AM

## 2024-01-01 NOTE — Anesthesia Preprocedure Evaluation (Signed)
 Anesthesia Evaluation  Patient identified by MRN, date of birth, ID band Patient awake    Reviewed: Allergy & Precautions, NPO status , Patient's Chart, lab work & pertinent test results  History of Anesthesia Complications Negative for: history of anesthetic complications  Airway Mallampati: II  TM Distance: >3 FB Neck ROM: Full    Dental no notable dental hx.    Pulmonary neg pulmonary ROS, neg sleep apnea, neg COPD   breath sounds clear to auscultation- rhonchi (-) wheezing      Cardiovascular hypertension, Pt. on medications (-) CAD, (-) Past MI, (-) Cardiac Stents and (-) CABG  Rhythm:Regular Rate:Normal - Systolic murmurs and - Diastolic murmurs    Neuro/Psych neg Seizures negative neurological ROS  negative psych ROS   GI/Hepatic negative GI ROS, Neg liver ROS,,,  Endo/Other  negative endocrine ROSneg diabetes    Renal/GU      Musculoskeletal   Abdominal   Peds  Hematology negative hematology ROS (+)   Anesthesia Other Findings Past Medical History: No date: History of kidney stones No date: Hyperlipidemia No date: Hypertension No date: Kidney stones   Reproductive/Obstetrics                             Anesthesia Physical Anesthesia Plan  ASA: 3  Anesthesia Plan: General   Post-op Pain Management: Minimal or no pain anticipated   Induction: Intravenous  PONV Risk Score and Plan: 3 and Propofol  infusion, TIVA and Ondansetron   Airway Management Planned: Nasal Cannula  Additional Equipment: None  Intra-op Plan:   Post-operative Plan:   Informed Consent: I have reviewed the patients History and Physical, chart, labs and discussed the procedure including the risks, benefits and alternatives for the proposed anesthesia with the patient or authorized representative who has indicated his/her understanding and acceptance.     Dental advisory given  Plan Discussed  with: CRNA and Surgeon  Anesthesia Plan Comments: (Discussed risks of anesthesia with patient, including possibility of difficulty with spontaneous ventilation under anesthesia necessitating airway intervention, PONV, and rare risks such as cardiac or respiratory or neurological events, and allergic reactions. Discussed the role of CRNA in patient's perioperative care. Patient understands.)       Anesthesia Quick Evaluation

## 2024-01-01 NOTE — Interval H&P Note (Signed)
 History and Physical Interval Note:  01/01/2024 8:57 AM  Cameron Randall  has presented today for surgery, with the diagnosis of Z86.0100 (ICD-10-CM) - History of colon polyps.  The various methods of treatment have been discussed with the patient and family. After consideration of risks, benefits and other options for treatment, the patient has consented to  Procedure(s): COLONOSCOPY (N/A) as a surgical intervention.  The patient's history has been reviewed, patient examined, no change in status, stable for surgery.  I have reviewed the patient's chart and labs.  Questions were answered to the patient's satisfaction.     Darden, Tamyrah Burbage

## 2024-01-01 NOTE — Anesthesia Postprocedure Evaluation (Signed)
 Anesthesia Post Note  Patient: Cameron Randall  Procedure(s) Performed: COLONOSCOPY POLYPECTOMY, INTESTINE  Patient location during evaluation: Endoscopy Anesthesia Type: General Level of consciousness: awake and alert Pain management: pain level controlled Vital Signs Assessment: post-procedure vital signs reviewed and stable Respiratory status: spontaneous breathing, nonlabored ventilation, respiratory function stable and patient connected to nasal cannula oxygen Cardiovascular status: blood pressure returned to baseline and stable Postop Assessment: no apparent nausea or vomiting Anesthetic complications: no  No notable events documented.   Last Vitals:  Vitals:   01/01/24 0926 01/01/24 0940  BP: 120/62 118/63  Pulse: 68 69  Resp: (!) 22 (!) 23  Temp: 36.7 C   SpO2: 95% 96%    Last Pain:  Vitals:   01/01/24 0940  TempSrc:   PainSc: 0-No pain                 Enrique Harvest

## 2024-01-03 LAB — SURGICAL PATHOLOGY

## 2024-06-17 ENCOUNTER — Ambulatory Visit (INDEPENDENT_AMBULATORY_CARE_PROVIDER_SITE_OTHER): Payer: Self-pay

## 2024-06-17 DIAGNOSIS — Z23 Encounter for immunization: Secondary | ICD-10-CM
